# Patient Record
Sex: Male | Born: 1950 | Race: White | Hispanic: No | Marital: Married
Health system: Southern US, Community
[De-identification: ages and names within clinical notes are randomized; demographics above are authoritative.]

---

## 2012-08-18 ENCOUNTER — Emergency Department: Payer: Self-pay | Admitting: Unknown Physician Specialty

## 2012-08-18 LAB — COMPREHENSIVE METABOLIC PANEL
Albumin: 4.2 g/dL (ref 3.4–5.0)
Alkaline Phosphatase: 84 U/L (ref 50–136)
Bilirubin,Total: 0.6 mg/dL (ref 0.2–1.0)
Calcium, Total: 8.8 mg/dL (ref 8.5–10.1)
Chloride: 107 mmol/L (ref 98–107)
Co2: 24 mmol/L (ref 21–32)
Creatinine: 0.9 mg/dL (ref 0.60–1.30)
EGFR (African American): >60
Glucose: 108 mg/dL — ABNORMAL HIGH (ref 65–99)
Osmolality: 279 (ref 275–301)
SGOT(AST): 19 U/L (ref 15–37)
Sodium: 139 mmol/L (ref 136–145)
Total Protein: 7.1 g/dL (ref 6.4–8.2)

## 2012-08-18 LAB — TROPONIN I: Troponin-I: <0.02 ng/mL

## 2012-08-18 LAB — CBC
HCT: 40.9 % (ref 40.0–52.0)
HGB: 14.4 g/dL (ref 13.0–18.0)
MCH: 34.7 pg — ABNORMAL HIGH (ref 26.0–34.0)
MCV: 99 fL (ref 80–100)
Platelet: 243 10*3/uL (ref 150–440)

## 2013-06-25 ENCOUNTER — Ambulatory Visit: Payer: Self-pay | Admitting: Surgery

## 2013-06-26 LAB — PATHOLOGY REPORT

## 2014-10-10 NOTE — Op Note (Signed)
PATIENT NAME:  Jason SpatesULISS, Arnol M MR#:  [id number] DATE OF BIRTH:  1950-08-24  DATE OF PROCEDURE:  06/25/2013  PREOPERATIVE DIAGNOSIS: Screening colonoscopy.   POSTOPERATIVE DIAGNOSIS: Colonic polyps.   PROCEDURE: Colonoscopy, biopsy and cautery and snare polypectomy.   SURGEON: Renda RollsWilton Manie Bealer, M.D.   ANESTHESIA: Monitored anesthesia care provided by the anesthesia staff.   INDICATIONS: This 64 year old male has a family history of colonic polyps and was advised to have screening colonoscopy.   He was placed on the stretcher in the left lateral decubitus position, sedated and monitored by the anesthesia staff with intermittent blood pressure checks, continuous pulse oximetry and given oxygen via nasal cannula.   With the patient in the left lateral decubitus position, the Olympus videocolonoscope was inserted and manipulated around to the cecum, which was identified by the ileocecal valve and the appendiceal orifice and palpation in the right lower quadrant. Colon preparation was good. The scope was gradually pulled back examining the colonic mucosa. There were 2 small polyps encountered at the splenic flexure, one appeared to be some 5 mm in size and was sessile; the other was approximately 2 to 3 mm in size. The smaller one was removed with hot biopsy and the other was biopsied 2 times and then removed with snare and cautery, and all specimens placed in the same container as polyps were just about 2 inches apart. Bleeding was very scant. Hemostasis was subsequently intact. The scope was gradually further pulled back, seeing, no other polyps or tumors more distally in the colon or the rectum. The rectum was retroflexed and did see some minimal enlargement of internal hemorrhoids. The scope was again straightened, and carbon dioxide was aspirated, and the scope was removed.   Digital anorectal exam demonstrated no palpable mass.   The patient was then moved to the recovery room in satisfactory  condition.     ____________________________ Shela CommonsJ. Renda RollsWilton Natalie Leclaire, MD jws:dmm D: 06/25/2013 09:02:24 ET T: 06/25/2013 09:52:01 ET JOB#: [id number]  cc: Adella HareJ. Wilton Kalanie Fewell, MD, <Dictator> Adella HareWILTON J Alyssah Algeo MD ELECTRONICALLY SIGNED 06/26/2013 19:52

## 2019-08-18 DIAGNOSIS — E039 Hypothyroidism, unspecified: Secondary | ICD-10-CM | POA: Diagnosis not present

## 2019-08-18 DIAGNOSIS — F419 Anxiety disorder, unspecified: Secondary | ICD-10-CM | POA: Diagnosis not present

## 2019-08-18 DIAGNOSIS — Z6825 Body mass index (BMI) 25.0-25.9, adult: Secondary | ICD-10-CM | POA: Diagnosis not present

## 2019-08-18 DIAGNOSIS — R000 Tachycardia, unspecified: Secondary | ICD-10-CM | POA: Diagnosis not present

## 2019-08-18 DIAGNOSIS — G2581 Restless legs syndrome: Secondary | ICD-10-CM | POA: Diagnosis not present

## 2019-10-07 DIAGNOSIS — Z720 Tobacco use: Secondary | ICD-10-CM | POA: Diagnosis not present

## 2019-10-07 DIAGNOSIS — R05 Cough: Secondary | ICD-10-CM | POA: Diagnosis not present

## 2019-10-07 DIAGNOSIS — M5412 Radiculopathy, cervical region: Secondary | ICD-10-CM | POA: Diagnosis not present

## 2019-10-07 DIAGNOSIS — Z6826 Body mass index (BMI) 26.0-26.9, adult: Secondary | ICD-10-CM | POA: Diagnosis not present

## 2019-10-14 DIAGNOSIS — M50222 Other cervical disc displacement at C5-C6 level: Secondary | ICD-10-CM | POA: Diagnosis not present

## 2019-10-14 DIAGNOSIS — M5033 Other cervical disc degeneration, cervicothoracic region: Secondary | ICD-10-CM | POA: Diagnosis not present

## 2019-10-14 DIAGNOSIS — M4802 Spinal stenosis, cervical region: Secondary | ICD-10-CM | POA: Diagnosis not present

## 2019-10-14 DIAGNOSIS — R05 Cough: Secondary | ICD-10-CM | POA: Diagnosis not present

## 2019-10-14 DIAGNOSIS — J8410 Pulmonary fibrosis, unspecified: Secondary | ICD-10-CM | POA: Diagnosis not present

## 2019-11-18 DIAGNOSIS — Z1331 Encounter for screening for depression: Secondary | ICD-10-CM | POA: Diagnosis not present

## 2019-11-18 DIAGNOSIS — M5412 Radiculopathy, cervical region: Secondary | ICD-10-CM | POA: Diagnosis not present

## 2019-11-18 DIAGNOSIS — Z139 Encounter for screening, unspecified: Secondary | ICD-10-CM | POA: Diagnosis not present

## 2019-11-18 DIAGNOSIS — Z9181 History of falling: Secondary | ICD-10-CM | POA: Diagnosis not present

## 2019-11-18 DIAGNOSIS — Z79899 Other long term (current) drug therapy: Secondary | ICD-10-CM | POA: Diagnosis not present

## 2019-11-18 DIAGNOSIS — I10 Essential (primary) hypertension: Secondary | ICD-10-CM | POA: Diagnosis not present

## 2019-11-18 DIAGNOSIS — E7800 Pure hypercholesterolemia, unspecified: Secondary | ICD-10-CM | POA: Diagnosis not present

## 2019-11-18 DIAGNOSIS — M5030 Other cervical disc degeneration, unspecified cervical region: Secondary | ICD-10-CM | POA: Diagnosis not present

## 2019-11-18 DIAGNOSIS — Z1159 Encounter for screening for other viral diseases: Secondary | ICD-10-CM | POA: Diagnosis not present

## 2019-11-18 DIAGNOSIS — R7303 Prediabetes: Secondary | ICD-10-CM | POA: Diagnosis not present

## 2019-11-27 DIAGNOSIS — M5414 Radiculopathy, thoracic region: Secondary | ICD-10-CM | POA: Diagnosis not present

## 2019-11-27 DIAGNOSIS — M47814 Spondylosis without myelopathy or radiculopathy, thoracic region: Secondary | ICD-10-CM | POA: Diagnosis not present

## 2019-11-27 DIAGNOSIS — M546 Pain in thoracic spine: Secondary | ICD-10-CM | POA: Diagnosis not present

## 2019-11-27 DIAGNOSIS — M542 Cervicalgia: Secondary | ICD-10-CM | POA: Diagnosis not present

## 2019-11-28 ENCOUNTER — Other Ambulatory Visit: Payer: Self-pay | Admitting: Nurse Practitioner

## 2019-11-28 DIAGNOSIS — M5414 Radiculopathy, thoracic region: Secondary | ICD-10-CM

## 2019-11-28 DIAGNOSIS — M542 Cervicalgia: Secondary | ICD-10-CM

## 2019-12-11 ENCOUNTER — Other Ambulatory Visit: Payer: Self-pay

## 2019-12-11 ENCOUNTER — Ambulatory Visit
Admission: RE | Admit: 2019-12-11 | Discharge: 2019-12-11 | Disposition: A | Discharge status: Home / Self Care | Payer: Medicare PPO | Source: Ambulatory Visit | Attending: Nurse Practitioner | Admitting: Nurse Practitioner

## 2019-12-11 DIAGNOSIS — M542 Cervicalgia: Secondary | ICD-10-CM

## 2019-12-11 DIAGNOSIS — M5414 Radiculopathy, thoracic region: Secondary | ICD-10-CM | POA: Diagnosis not present

## 2019-12-11 DIAGNOSIS — M5124 Other intervertebral disc displacement, thoracic region: Secondary | ICD-10-CM | POA: Diagnosis not present

## 2019-12-11 DIAGNOSIS — M4804 Spinal stenosis, thoracic region: Secondary | ICD-10-CM | POA: Diagnosis not present

## 2019-12-11 IMAGING — MR MR THORACIC SPINE W/O CM
6 of 7 series · 27 of 48 positions shown · non-contrast
Comparison: Chest CT [DATE]

CLINICAL DATA: Thoracic radiculopathy. Chronic neck, back, and arm
pain including around the left scapula and anteriorly through the
left chest.

EXAM:
MRI THORACIC SPINE WITHOUT CONTRAST
TECHNIQUE: Multiplanar, multisequence MR imaging of the thoracic spine was
performed. No intravenous contrast was administered.

[Series 17: T2 · sagittal · 3.0mm · 1.06mm/px · 3 of 17 slices shown (1 of 2)]
[im 1/17]
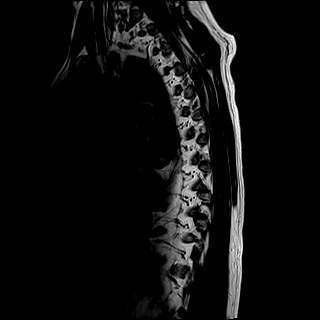
[im 9/17]
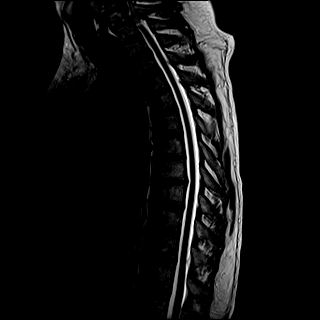
[im 17/17]
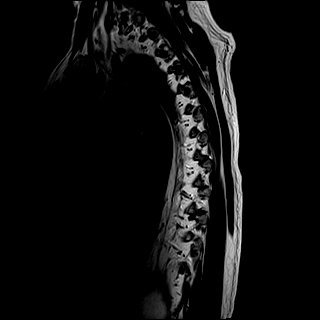

[Series 18: T1 · sagittal · 3.0mm · 1.06mm/px · 3 of 17 slices shown (1 of 2)]
[im 1/17]
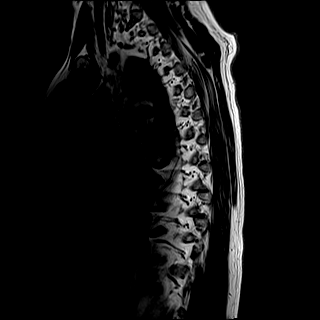
[im 9/17]
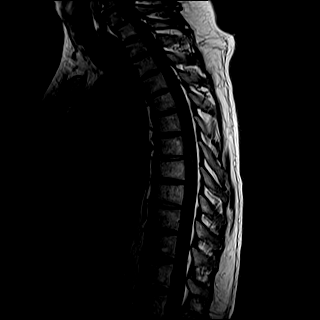
[im 17/17]
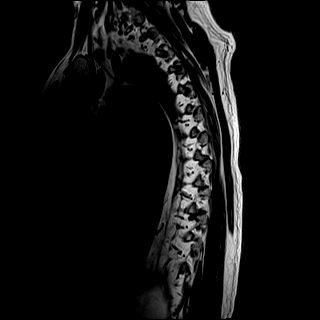

[Series 19: STIR · sagittal · 3.0mm · 0.53mm/px · 3 of 17 slices shown]
[im 1/17]
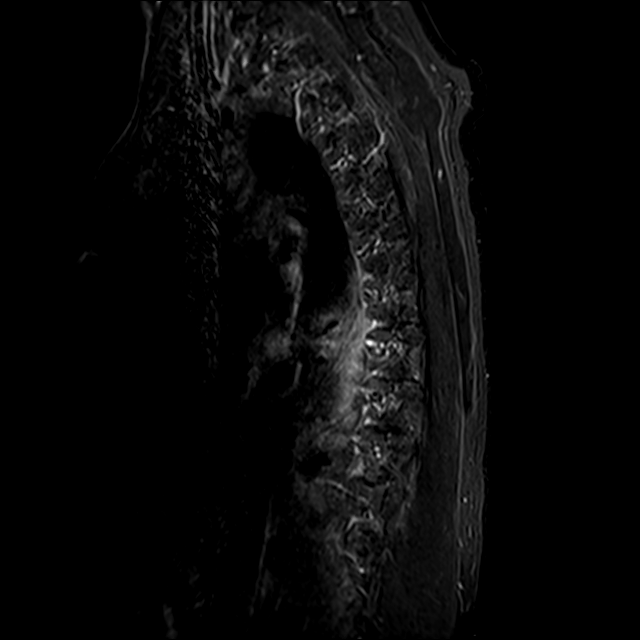
[im 9/17]
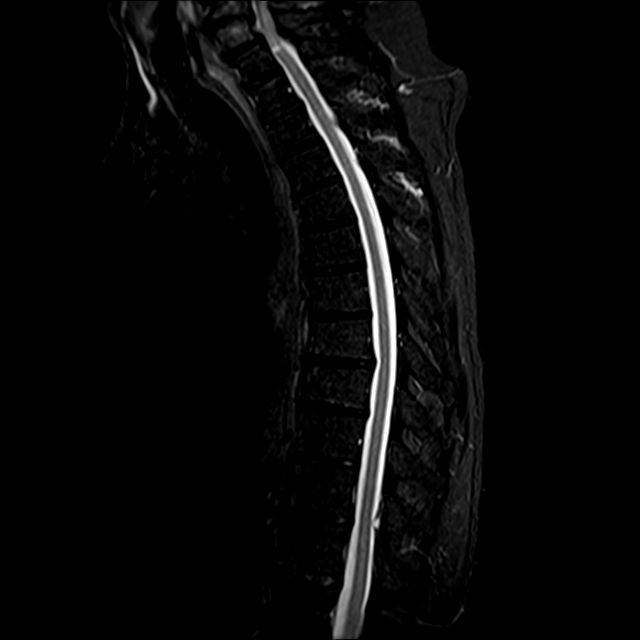
[im 17/17]
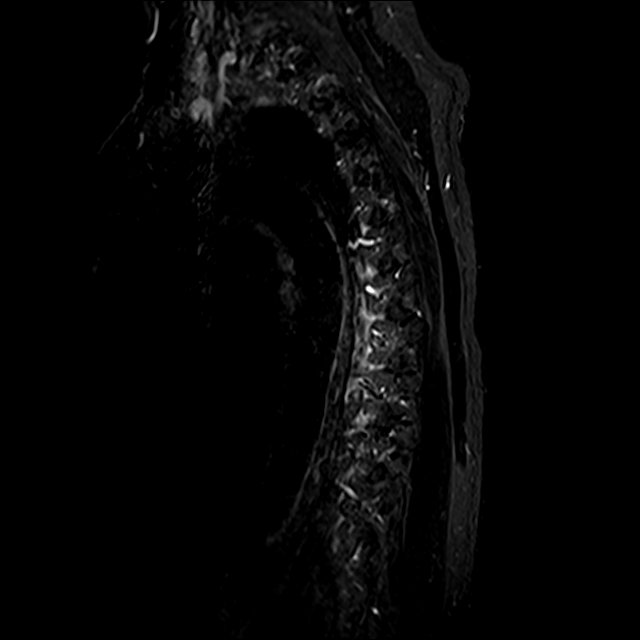

[Series 21: T2 · axial · 4.0mm · 0.59mm/px · z∈[-395,-154]mm · 8 of 39 slices shown (2 of 2)]
[im 1/39]
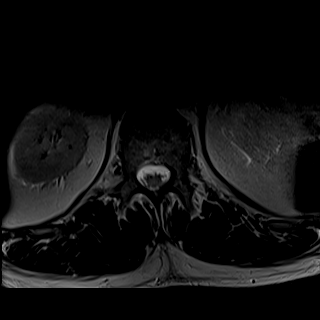
[im 6/39]
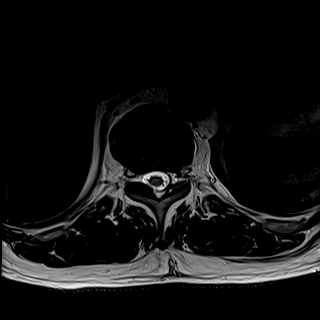
[im 11/39]
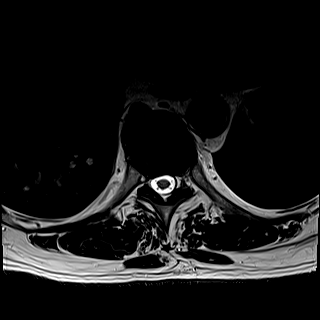
[im 17/39]
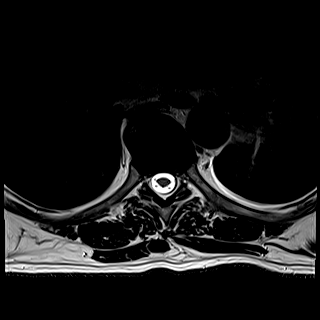
[im 22/39]
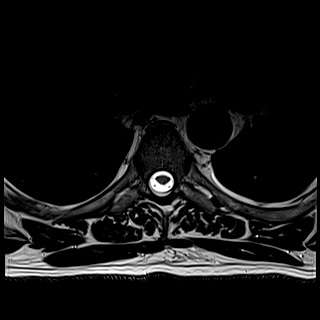
[im 28/39]
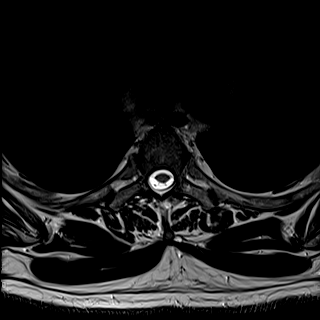
[im 33/39]
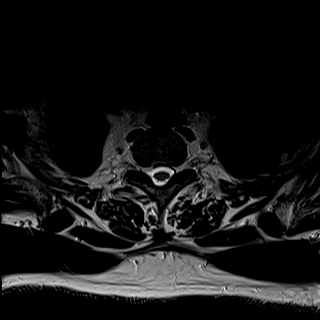
[im 39/39]
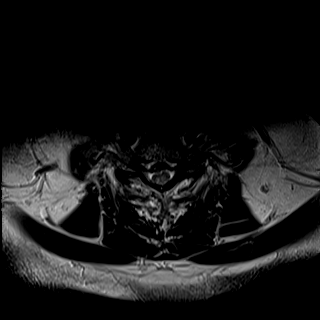

[Series 22: T1 · sagittal · 5.0mm · 1.88mm/px · 2 of 9 slices shown (2 of 2)]
[im 1/9]
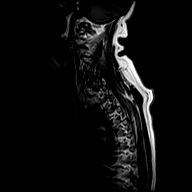
[im 9/9]
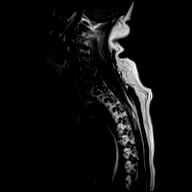

[Series 23: GRE · axial · 4.0mm · 0.37mm/px · z∈[-395,-154]mm · 8 of 39 slices shown]
[im 1/39]
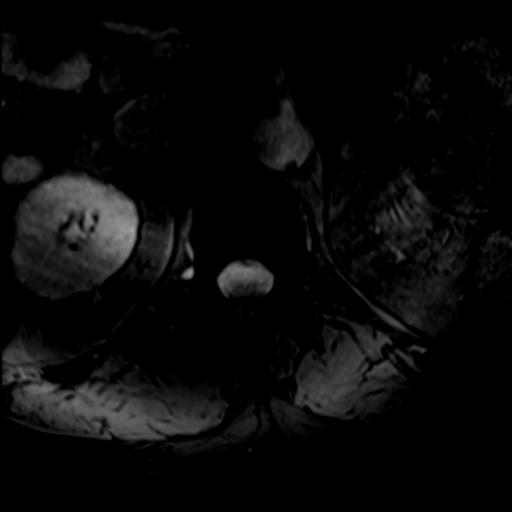
[im 6/39]
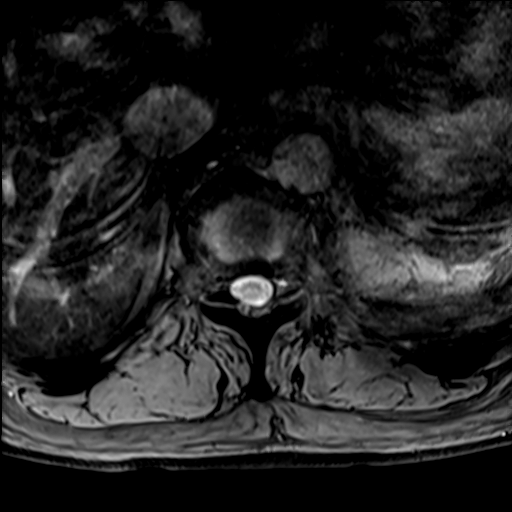
[im 11/39]
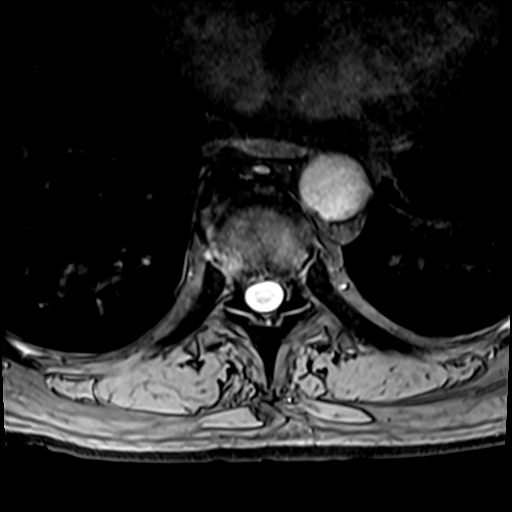
[im 17/39]
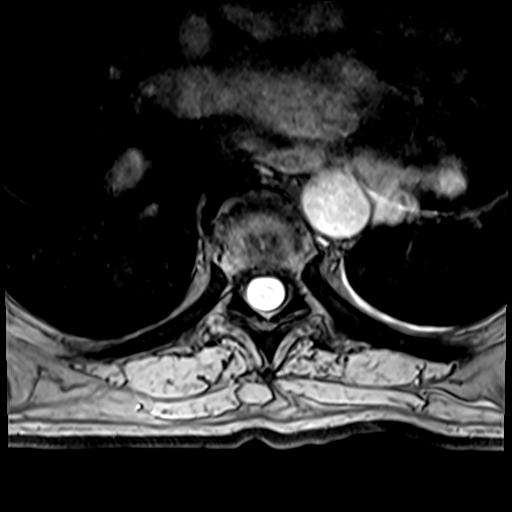
[im 22/39]
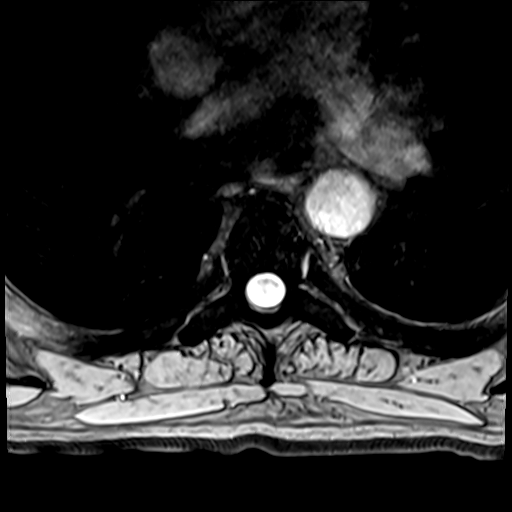
[im 28/39]
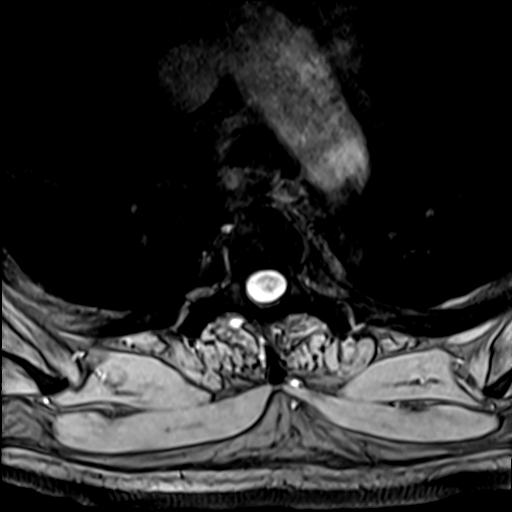
[im 33/39]
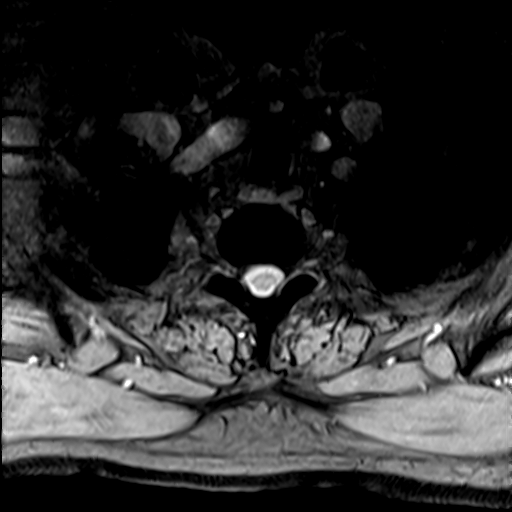
[im 39/39]
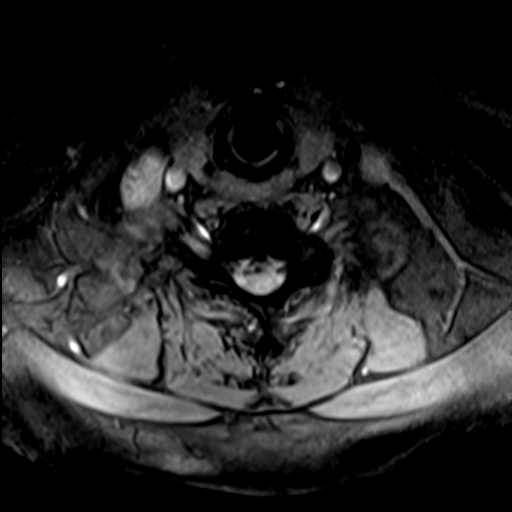

[27 of 48 positions shown; findings below may reference images not displayed]

FINDINGS: Alignment:  Grade 1 anterolisthesis of C7 on T1.

Vertebrae: No fracture or destructive osseous lesion. Edema along
the right aspect of the T12 inferior endplate, favored to be
degenerative. Edema in the C6 and C7 vertebral bodies, more fully
evaluated on separate cervical spine MRI.

Cord: Abnormal focal T2 hyperintensity in the spinal cord at the
upper T1 level. Normal thoracic cord signal elsewhere.

Paraspinal and other soft tissues: Unremarkable.

Disc levels:

Disc and severe facet degeneration at C7-T1 with grade 1
anterolisthesis, spinal stenosis, and advanced bilateral neural
foraminal stenosis, more fully evaluated on separate cervical spine
MRI.

Small right central disc protrusion at T8-9 and very shallow
central/right central disc protrusion at T9-10 without associated
stenosis or significant spinal cord mass effect. Moderate to severe
multilevel upper thoracic facet arthrosis with mild disc bulging and
endplate spurring at T1-2 contributing to mild-to-moderate right
neural foraminal stenosis.
IMPRESSION: 1. Severe facet arthrosis at C7-T1 with grade 1 anterolisthesis,
spinal stenosis, and advanced bilateral neural foraminal stenosis as
seen on the separate cervical spine MRI. Focal spinal cord signal
abnormality at this level compatible with myelomalacia.
2. Advanced upper thoracic facet arthrosis with mild-to-moderate
right neural foraminal stenosis at T1-2.
3. Small disc protrusions at T8-9 and T9-10 without stenosis.

## 2019-12-11 IMAGING — MR MR CERVICAL SPINE W/O CM
5 series · 37 of 48 positions shown · non-contrast
Comparison: None.

CLINICAL DATA: Neck pain. Left scapula and chest pain. Left upper
extremity pain to the wrist. If

EXAM:
MRI CERVICAL SPINE WITHOUT CONTRAST
TECHNIQUE: Multiplanar, multisequence MR imaging of the cervical spine was
performed. No intravenous contrast was administered.

[Series 5: T2 · sagittal · 3.0mm · 0.62mm/px · 6 of 15 slices shown (1 of 2)]
[im 1/15]
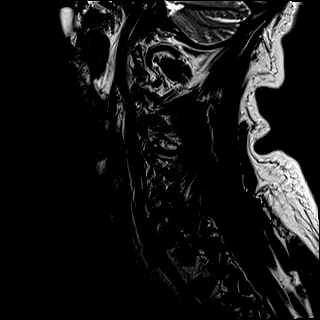
[im 3/15]
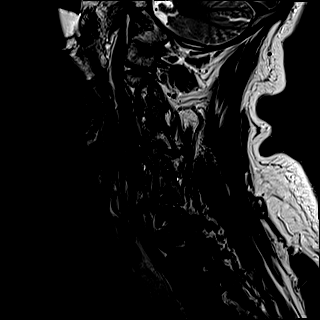
[im 6/15]
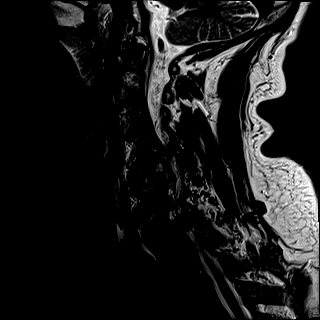
[im 9/15]
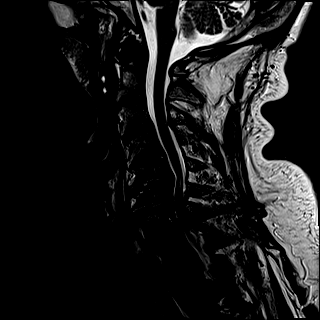
[im 12/15]
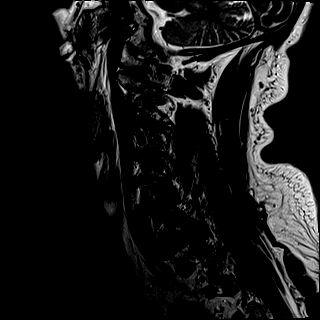
[im 15/15]
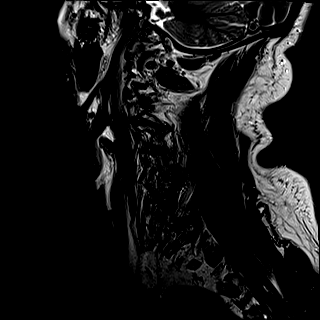

[Series 6: FLAIR · sagittal · 3.0mm · 0.78mm/px · 7 of 15 slices shown]
[im 1/15]
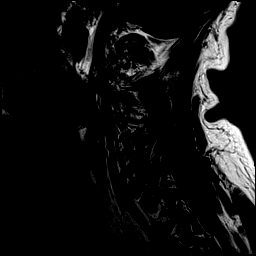
[im 3/15]
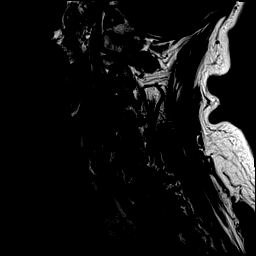
[im 5/15]
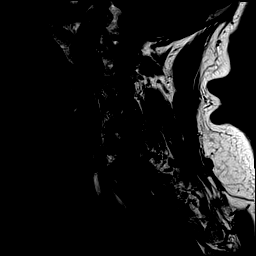
[im 8/15]
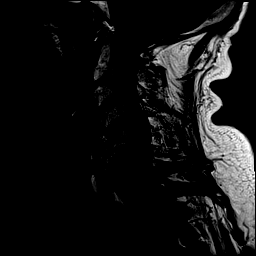
[im 10/15]
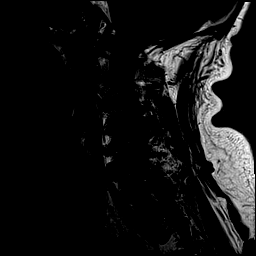
[im 12/15]
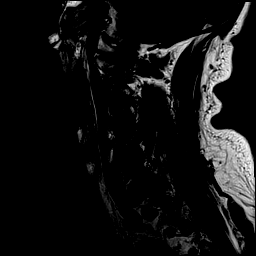
[im 15/15]
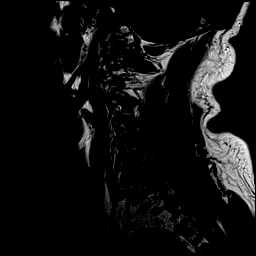

[Series 7: STIR · sagittal · 3.0mm · 0.62mm/px · 7 of 15 slices shown]
[im 1/15]
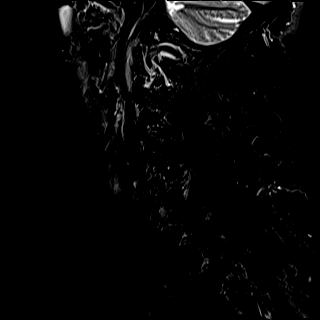
[im 3/15]
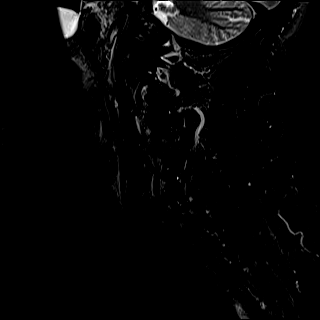
[im 5/15]
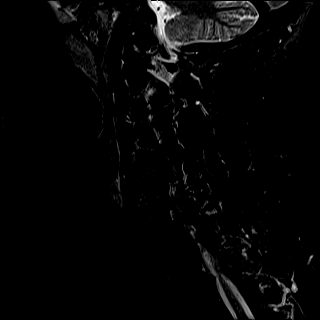
[im 8/15]
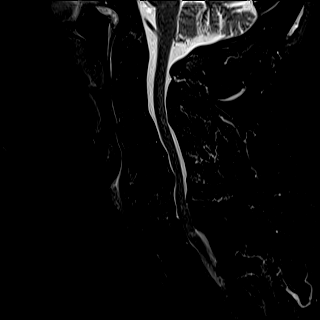
[im 10/15]
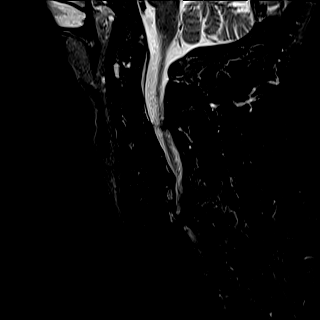
[im 12/15]
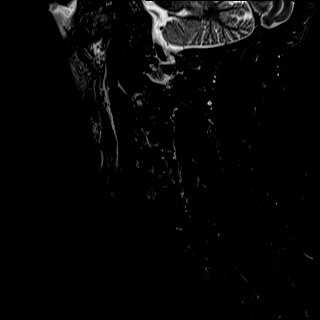
[im 15/15]
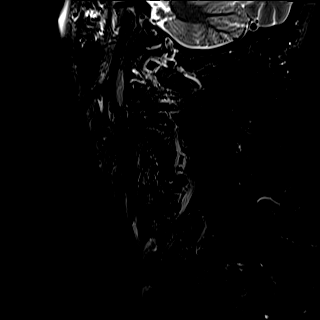

[Series 8: T2 · axial · 3.0mm · 0.70mm/px · z∈[-142,-46]mm · 9 of 29 slices shown (2 of 2)]
[im 1/29]
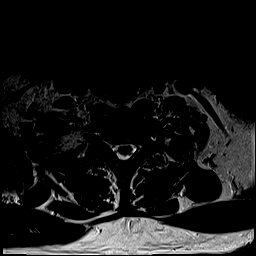
[im 3/29]
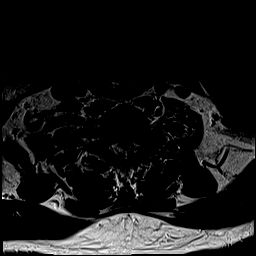
[im 5/29]
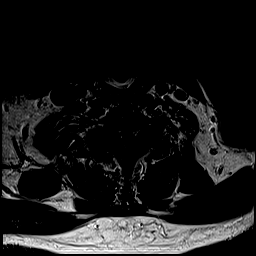
[im 9/29]
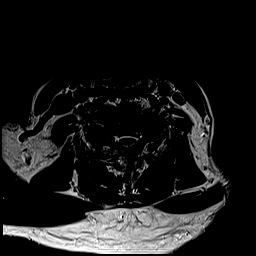
[im 13/29]
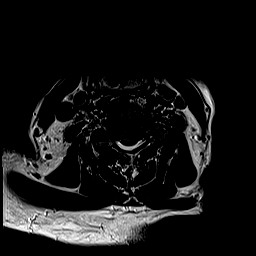
[im 16/29]
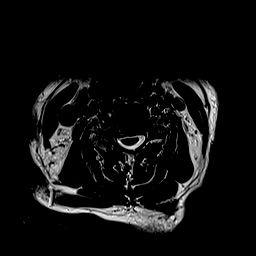
[im 20/29]
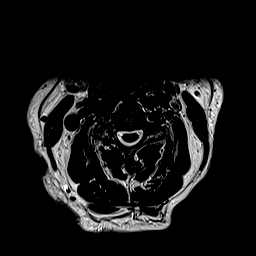
[im 24/29]
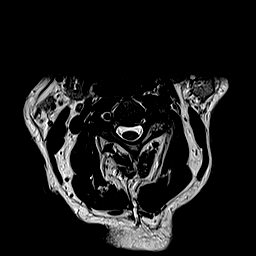
[im 29/29]
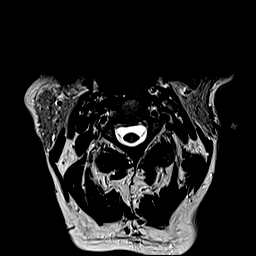

[Series 9: ax mpgr · axial · 3.0mm · 0.35mm/px · z∈[-142,-46]mm · 8 of 29 slices shown]
[im 1/29]
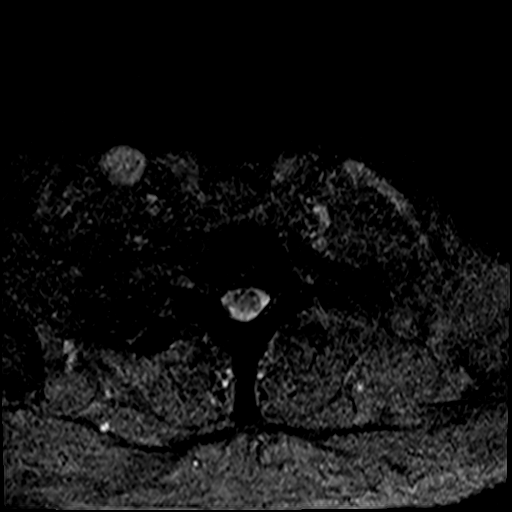
[im 5/29]
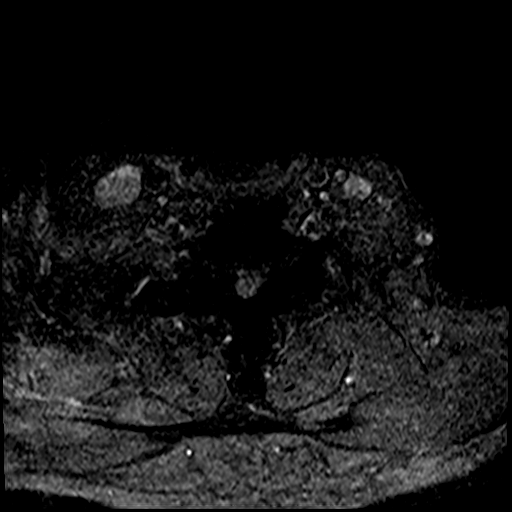
[im 9/29]
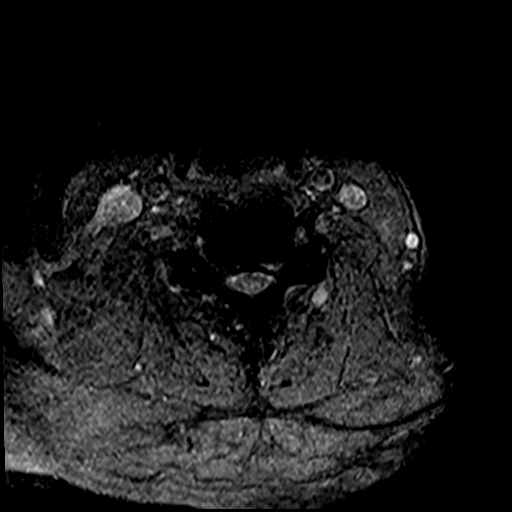
[im 13/29]
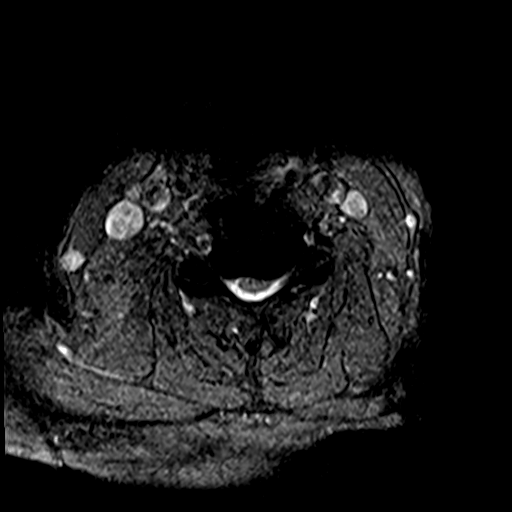
[im 16/29]
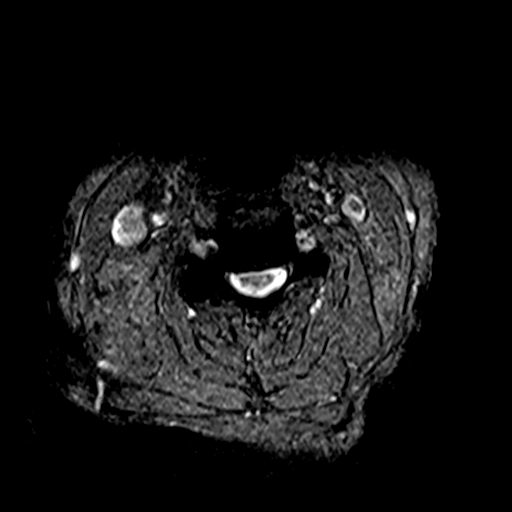
[im 20/29]
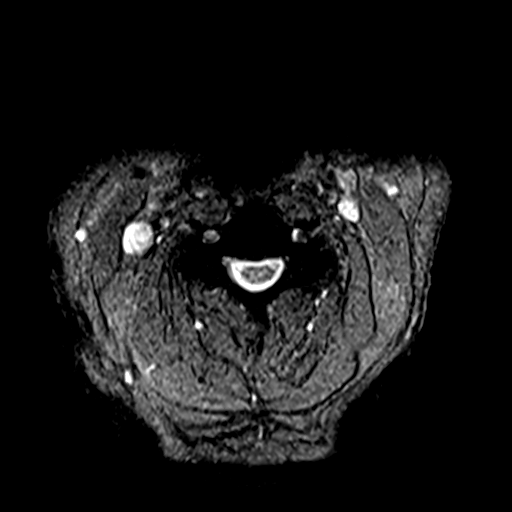
[im 24/29]
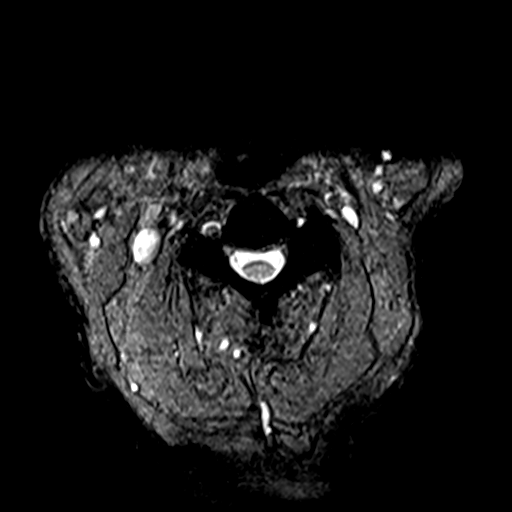
[im 29/29]
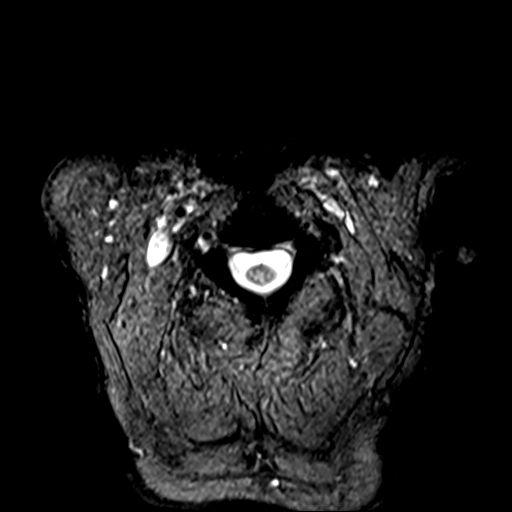

[37 of 48 positions shown; findings below may reference images not displayed]

FINDINGS: Alignment: Slight retrolisthesis is present at C5-6 and C6-7. Grade
1 anterolisthesis at C7-T1 measures 4 mm. Slight anterolisthesis is
present at C3-4.

Vertebrae: Chronic fatty and edematous endplate marrow changes are
present at C5-6 and C6-7. Vertebral body heights are maintained.
There is some edema within the C6 and C7 vertebral bodies.

Cord: Focal T2 hyperintensity in the left lateral aspect of the cord
at C5 likely represents chronic myelomalacia. More mild T2
hyperintensity of the cord is present at C6-7 and C7-T1. No other
cord signal abnormalities are present. Significant narrowing of the
spinal cord is present at C5-6.

Posterior Fossa, vertebral arteries, paraspinal tissues:
Craniocervical junction is normal. Flow is present in the vertebral
arteries bilaterally. Visualized intracranial contents are normal.

Disc levels:

C1-2: Negative.

C2-3: Negative.

C3-4: A shallow disc bulge is present. Asymmetric left-sided facet
hypertrophy results in severe left and moderate right foraminal
stenosis.

C4-5: A rightward disc osteophyte complex is present. Facet
hypertrophy is worse on the right. Moderate foraminal scratched at
moderate to severe foraminal narrowing is present bilaterally, right
greater than left. There is effacement ventral CSF and some
narrowing of the cord.

C5-6: A broad-based disc osteophyte complex effaces the ventral CSF
and narrows the canal to 7 mm. Severe foraminal stenosis is present
bilaterally.

C6-7: A broad-based disc osteophyte complex is present. Effacement
of the ventral CSF is noted. Uncovertebral spurring contributes to
severe foraminal narrowing, left greater than right.

C7-T1: Advanced facet hypertrophy is present. Broad-based disc
protrusion extends into the foramina bilaterally. Severe right and
moderate left foraminal narrowing is present.
IMPRESSION: 1. Severe left and moderate right foraminal stenosis at C3-4.
2. Moderate foraminal narrowing bilaterally at C4-5 is worse on the
right.
3. Severe foraminal narrowing bilaterally at C5-6 and C6-7 is worse
on the left.
4. Severe right and moderate left foraminal stenosis at C7-T1.
5. Moderate central canal stenosis at C5-6 and C6-7.
6. Abnormal cord signal at C5-6 and C6-7 is likely due to chronic
myelomalacia.

## 2019-12-19 DIAGNOSIS — M542 Cervicalgia: Secondary | ICD-10-CM | POA: Diagnosis not present

## 2019-12-19 DIAGNOSIS — M5412 Radiculopathy, cervical region: Secondary | ICD-10-CM | POA: Diagnosis not present

## 2019-12-19 DIAGNOSIS — M4802 Spinal stenosis, cervical region: Secondary | ICD-10-CM | POA: Diagnosis not present

## 2020-01-07 DIAGNOSIS — M5412 Radiculopathy, cervical region: Secondary | ICD-10-CM | POA: Diagnosis not present

## 2020-01-21 DIAGNOSIS — M5414 Radiculopathy, thoracic region: Secondary | ICD-10-CM | POA: Diagnosis not present

## 2020-01-21 DIAGNOSIS — G8929 Other chronic pain: Secondary | ICD-10-CM | POA: Diagnosis not present

## 2020-01-21 DIAGNOSIS — M546 Pain in thoracic spine: Secondary | ICD-10-CM | POA: Diagnosis not present

## 2020-01-21 DIAGNOSIS — M542 Cervicalgia: Secondary | ICD-10-CM | POA: Diagnosis not present

## 2020-01-21 DIAGNOSIS — M5412 Radiculopathy, cervical region: Secondary | ICD-10-CM | POA: Diagnosis not present

## 2020-01-21 DIAGNOSIS — M4802 Spinal stenosis, cervical region: Secondary | ICD-10-CM | POA: Diagnosis not present

## 2020-02-05 DIAGNOSIS — Z79899 Other long term (current) drug therapy: Secondary | ICD-10-CM | POA: Diagnosis not present

## 2020-02-05 DIAGNOSIS — R590 Localized enlarged lymph nodes: Secondary | ICD-10-CM | POA: Diagnosis not present

## 2020-02-05 DIAGNOSIS — I700 Atherosclerosis of aorta: Secondary | ICD-10-CM | POA: Diagnosis not present

## 2020-02-05 DIAGNOSIS — R918 Other nonspecific abnormal finding of lung field: Secondary | ICD-10-CM | POA: Diagnosis not present

## 2020-02-05 DIAGNOSIS — R042 Hemoptysis: Secondary | ICD-10-CM | POA: Diagnosis not present

## 2020-02-05 DIAGNOSIS — J439 Emphysema, unspecified: Secondary | ICD-10-CM | POA: Diagnosis not present

## 2020-02-05 DIAGNOSIS — Z6826 Body mass index (BMI) 26.0-26.9, adult: Secondary | ICD-10-CM | POA: Diagnosis not present

## 2020-02-12 DIAGNOSIS — I2510 Atherosclerotic heart disease of native coronary artery without angina pectoris: Secondary | ICD-10-CM | POA: Diagnosis not present

## 2020-02-12 DIAGNOSIS — R918 Other nonspecific abnormal finding of lung field: Secondary | ICD-10-CM | POA: Diagnosis not present

## 2020-02-12 DIAGNOSIS — J439 Emphysema, unspecified: Secondary | ICD-10-CM | POA: Diagnosis not present

## 2020-02-12 DIAGNOSIS — R042 Hemoptysis: Secondary | ICD-10-CM | POA: Diagnosis not present

## 2020-02-12 DIAGNOSIS — I700 Atherosclerosis of aorta: Secondary | ICD-10-CM | POA: Diagnosis not present

## 2020-02-18 DIAGNOSIS — R05 Cough: Secondary | ICD-10-CM | POA: Diagnosis not present

## 2020-02-18 DIAGNOSIS — R042 Hemoptysis: Secondary | ICD-10-CM | POA: Diagnosis not present

## 2020-02-18 DIAGNOSIS — R918 Other nonspecific abnormal finding of lung field: Secondary | ICD-10-CM | POA: Diagnosis not present

## 2020-02-20 ENCOUNTER — Other Ambulatory Visit: Payer: Self-pay | Admitting: Specialist

## 2020-02-20 DIAGNOSIS — R918 Other nonspecific abnormal finding of lung field: Secondary | ICD-10-CM

## 2020-02-24 DIAGNOSIS — R918 Other nonspecific abnormal finding of lung field: Secondary | ICD-10-CM | POA: Diagnosis not present

## 2020-02-26 ENCOUNTER — Institutional Professional Consult (permissible substitution): Payer: Medicare PPO | Admitting: Pulmonary Disease

## 2020-02-26 ENCOUNTER — Other Ambulatory Visit: Payer: Self-pay | Admitting: Pulmonary Disease

## 2020-02-29 DIAGNOSIS — Z20822 Contact with and (suspected) exposure to covid-19: Secondary | ICD-10-CM | POA: Diagnosis not present

## 2020-03-03 DIAGNOSIS — Z7951 Long term (current) use of inhaled steroids: Secondary | ICD-10-CM | POA: Diagnosis not present

## 2020-03-03 DIAGNOSIS — D491 Neoplasm of unspecified behavior of respiratory system: Secondary | ICD-10-CM | POA: Diagnosis not present

## 2020-03-03 DIAGNOSIS — R591 Generalized enlarged lymph nodes: Secondary | ICD-10-CM | POA: Diagnosis not present

## 2020-03-03 DIAGNOSIS — J449 Chronic obstructive pulmonary disease, unspecified: Secondary | ICD-10-CM | POA: Diagnosis not present

## 2020-03-03 DIAGNOSIS — E785 Hyperlipidemia, unspecified: Secondary | ICD-10-CM | POA: Diagnosis not present

## 2020-03-03 DIAGNOSIS — C771 Secondary and unspecified malignant neoplasm of intrathoracic lymph nodes: Secondary | ICD-10-CM | POA: Diagnosis not present

## 2020-03-03 DIAGNOSIS — C3490 Malignant neoplasm of unspecified part of unspecified bronchus or lung: Secondary | ICD-10-CM | POA: Diagnosis not present

## 2020-03-03 DIAGNOSIS — R918 Other nonspecific abnormal finding of lung field: Secondary | ICD-10-CM | POA: Diagnosis not present

## 2020-03-03 DIAGNOSIS — Z79899 Other long term (current) drug therapy: Secondary | ICD-10-CM | POA: Diagnosis not present

## 2020-03-03 DIAGNOSIS — C7989 Secondary malignant neoplasm of other specified sites: Secondary | ICD-10-CM | POA: Diagnosis not present

## 2020-03-03 DIAGNOSIS — Z87891 Personal history of nicotine dependence: Secondary | ICD-10-CM | POA: Diagnosis not present

## 2020-03-03 DIAGNOSIS — C801 Malignant (primary) neoplasm, unspecified: Secondary | ICD-10-CM | POA: Diagnosis not present

## 2020-03-05 DIAGNOSIS — R918 Other nonspecific abnormal finding of lung field: Secondary | ICD-10-CM | POA: Diagnosis not present

## 2020-03-05 DIAGNOSIS — R930 Abnormal findings on diagnostic imaging of skull and head, not elsewhere classified: Secondary | ICD-10-CM | POA: Diagnosis not present

## 2020-03-12 ENCOUNTER — Other Ambulatory Visit: Payer: Medicare PPO

## 2020-03-16 DIAGNOSIS — E785 Hyperlipidemia, unspecified: Secondary | ICD-10-CM | POA: Diagnosis not present

## 2020-03-16 DIAGNOSIS — C3490 Malignant neoplasm of unspecified part of unspecified bronchus or lung: Secondary | ICD-10-CM | POA: Diagnosis not present

## 2020-03-16 DIAGNOSIS — J449 Chronic obstructive pulmonary disease, unspecified: Secondary | ICD-10-CM | POA: Diagnosis not present

## 2020-03-16 DIAGNOSIS — Z87891 Personal history of nicotine dependence: Secondary | ICD-10-CM | POA: Diagnosis not present

## 2020-03-16 DIAGNOSIS — Z79899 Other long term (current) drug therapy: Secondary | ICD-10-CM | POA: Diagnosis not present

## 2020-03-17 ENCOUNTER — Other Ambulatory Visit: Admission: RE | Admit: 2020-03-17 | Payer: Medicare PPO | Source: Ambulatory Visit

## 2020-03-19 ENCOUNTER — Encounter: Admission: RE | Payer: Self-pay | Source: Home / Self Care

## 2020-03-19 ENCOUNTER — Ambulatory Visit: Admission: RE | Admit: 2020-03-19 | Payer: Medicare PPO | Source: Home / Self Care

## 2020-03-19 DIAGNOSIS — C3490 Malignant neoplasm of unspecified part of unspecified bronchus or lung: Secondary | ICD-10-CM | POA: Diagnosis not present

## 2020-03-19 DIAGNOSIS — J449 Chronic obstructive pulmonary disease, unspecified: Secondary | ICD-10-CM | POA: Diagnosis not present

## 2020-03-19 DIAGNOSIS — C3482 Malignant neoplasm of overlapping sites of left bronchus and lung: Secondary | ICD-10-CM | POA: Diagnosis not present

## 2020-03-19 DIAGNOSIS — Z87891 Personal history of nicotine dependence: Secondary | ICD-10-CM | POA: Diagnosis not present

## 2020-03-19 DIAGNOSIS — E785 Hyperlipidemia, unspecified: Secondary | ICD-10-CM | POA: Diagnosis not present

## 2020-03-19 DIAGNOSIS — Z79899 Other long term (current) drug therapy: Secondary | ICD-10-CM | POA: Diagnosis not present

## 2020-03-19 SURGERY — BRONCHOSCOPY, WITH EBUS
Anesthesia: General

## 2020-03-22 DIAGNOSIS — R7303 Prediabetes: Secondary | ICD-10-CM | POA: Diagnosis not present

## 2020-03-22 DIAGNOSIS — F419 Anxiety disorder, unspecified: Secondary | ICD-10-CM | POA: Diagnosis not present

## 2020-03-22 DIAGNOSIS — I700 Atherosclerosis of aorta: Secondary | ICD-10-CM | POA: Diagnosis not present

## 2020-03-22 DIAGNOSIS — I10 Essential (primary) hypertension: Secondary | ICD-10-CM | POA: Diagnosis not present

## 2020-03-22 DIAGNOSIS — M5412 Radiculopathy, cervical region: Secondary | ICD-10-CM | POA: Diagnosis not present

## 2020-03-22 DIAGNOSIS — G2581 Restless legs syndrome: Secondary | ICD-10-CM | POA: Diagnosis not present

## 2020-03-22 DIAGNOSIS — C3490 Malignant neoplasm of unspecified part of unspecified bronchus or lung: Secondary | ICD-10-CM | POA: Diagnosis not present

## 2020-03-22 DIAGNOSIS — E7800 Pure hypercholesterolemia, unspecified: Secondary | ICD-10-CM | POA: Diagnosis not present

## 2020-03-22 DIAGNOSIS — M5030 Other cervical disc degeneration, unspecified cervical region: Secondary | ICD-10-CM | POA: Diagnosis not present

## 2020-03-29 DIAGNOSIS — J449 Chronic obstructive pulmonary disease, unspecified: Secondary | ICD-10-CM | POA: Diagnosis not present

## 2020-03-29 DIAGNOSIS — Z87891 Personal history of nicotine dependence: Secondary | ICD-10-CM | POA: Diagnosis not present

## 2020-03-29 DIAGNOSIS — E785 Hyperlipidemia, unspecified: Secondary | ICD-10-CM | POA: Diagnosis not present

## 2020-03-29 DIAGNOSIS — Z79899 Other long term (current) drug therapy: Secondary | ICD-10-CM | POA: Diagnosis not present

## 2020-03-29 DIAGNOSIS — C3490 Malignant neoplasm of unspecified part of unspecified bronchus or lung: Secondary | ICD-10-CM | POA: Diagnosis not present

## 2020-03-29 DIAGNOSIS — C3482 Malignant neoplasm of overlapping sites of left bronchus and lung: Secondary | ICD-10-CM | POA: Diagnosis not present

## 2020-03-30 DIAGNOSIS — Z79899 Other long term (current) drug therapy: Secondary | ICD-10-CM | POA: Diagnosis not present

## 2020-03-30 DIAGNOSIS — E785 Hyperlipidemia, unspecified: Secondary | ICD-10-CM | POA: Diagnosis not present

## 2020-03-30 DIAGNOSIS — C3492 Malignant neoplasm of unspecified part of left bronchus or lung: Secondary | ICD-10-CM | POA: Diagnosis not present

## 2020-03-30 DIAGNOSIS — J449 Chronic obstructive pulmonary disease, unspecified: Secondary | ICD-10-CM | POA: Diagnosis not present

## 2020-03-30 DIAGNOSIS — C3490 Malignant neoplasm of unspecified part of unspecified bronchus or lung: Secondary | ICD-10-CM | POA: Diagnosis not present

## 2020-03-30 DIAGNOSIS — Z5111 Encounter for antineoplastic chemotherapy: Secondary | ICD-10-CM | POA: Diagnosis not present

## 2020-03-30 DIAGNOSIS — C3482 Malignant neoplasm of overlapping sites of left bronchus and lung: Secondary | ICD-10-CM | POA: Diagnosis not present

## 2020-03-30 DIAGNOSIS — Z87891 Personal history of nicotine dependence: Secondary | ICD-10-CM | POA: Diagnosis not present

## 2020-03-31 DIAGNOSIS — Z87891 Personal history of nicotine dependence: Secondary | ICD-10-CM | POA: Diagnosis not present

## 2020-03-31 DIAGNOSIS — C3482 Malignant neoplasm of overlapping sites of left bronchus and lung: Secondary | ICD-10-CM | POA: Diagnosis not present

## 2020-03-31 DIAGNOSIS — C3490 Malignant neoplasm of unspecified part of unspecified bronchus or lung: Secondary | ICD-10-CM | POA: Diagnosis not present

## 2020-03-31 DIAGNOSIS — Z79899 Other long term (current) drug therapy: Secondary | ICD-10-CM | POA: Diagnosis not present

## 2020-03-31 DIAGNOSIS — E785 Hyperlipidemia, unspecified: Secondary | ICD-10-CM | POA: Diagnosis not present

## 2020-03-31 DIAGNOSIS — J449 Chronic obstructive pulmonary disease, unspecified: Secondary | ICD-10-CM | POA: Diagnosis not present

## 2020-04-01 DIAGNOSIS — C3490 Malignant neoplasm of unspecified part of unspecified bronchus or lung: Secondary | ICD-10-CM | POA: Diagnosis not present

## 2020-04-01 DIAGNOSIS — Z79899 Other long term (current) drug therapy: Secondary | ICD-10-CM | POA: Diagnosis not present

## 2020-04-01 DIAGNOSIS — J449 Chronic obstructive pulmonary disease, unspecified: Secondary | ICD-10-CM | POA: Diagnosis not present

## 2020-04-01 DIAGNOSIS — E785 Hyperlipidemia, unspecified: Secondary | ICD-10-CM | POA: Diagnosis not present

## 2020-04-01 DIAGNOSIS — Z87891 Personal history of nicotine dependence: Secondary | ICD-10-CM | POA: Diagnosis not present

## 2020-04-01 DIAGNOSIS — C3482 Malignant neoplasm of overlapping sites of left bronchus and lung: Secondary | ICD-10-CM | POA: Diagnosis not present

## 2020-04-02 DIAGNOSIS — C3490 Malignant neoplasm of unspecified part of unspecified bronchus or lung: Secondary | ICD-10-CM | POA: Diagnosis not present

## 2020-04-02 DIAGNOSIS — J449 Chronic obstructive pulmonary disease, unspecified: Secondary | ICD-10-CM | POA: Diagnosis not present

## 2020-04-02 DIAGNOSIS — C3482 Malignant neoplasm of overlapping sites of left bronchus and lung: Secondary | ICD-10-CM | POA: Diagnosis not present

## 2020-04-02 DIAGNOSIS — Z87891 Personal history of nicotine dependence: Secondary | ICD-10-CM | POA: Diagnosis not present

## 2020-04-02 DIAGNOSIS — E785 Hyperlipidemia, unspecified: Secondary | ICD-10-CM | POA: Diagnosis not present

## 2020-04-02 DIAGNOSIS — Z79899 Other long term (current) drug therapy: Secondary | ICD-10-CM | POA: Diagnosis not present

## 2020-04-05 DIAGNOSIS — C3490 Malignant neoplasm of unspecified part of unspecified bronchus or lung: Secondary | ICD-10-CM | POA: Diagnosis not present

## 2020-04-05 DIAGNOSIS — E785 Hyperlipidemia, unspecified: Secondary | ICD-10-CM | POA: Diagnosis not present

## 2020-04-05 DIAGNOSIS — Z87891 Personal history of nicotine dependence: Secondary | ICD-10-CM | POA: Diagnosis not present

## 2020-04-05 DIAGNOSIS — C3482 Malignant neoplasm of overlapping sites of left bronchus and lung: Secondary | ICD-10-CM | POA: Diagnosis not present

## 2020-04-05 DIAGNOSIS — Z79899 Other long term (current) drug therapy: Secondary | ICD-10-CM | POA: Diagnosis not present

## 2020-04-05 DIAGNOSIS — J449 Chronic obstructive pulmonary disease, unspecified: Secondary | ICD-10-CM | POA: Diagnosis not present

## 2020-04-06 DIAGNOSIS — J449 Chronic obstructive pulmonary disease, unspecified: Secondary | ICD-10-CM | POA: Diagnosis not present

## 2020-04-06 DIAGNOSIS — C3490 Malignant neoplasm of unspecified part of unspecified bronchus or lung: Secondary | ICD-10-CM | POA: Diagnosis not present

## 2020-04-06 DIAGNOSIS — Z87891 Personal history of nicotine dependence: Secondary | ICD-10-CM | POA: Diagnosis not present

## 2020-04-06 DIAGNOSIS — E785 Hyperlipidemia, unspecified: Secondary | ICD-10-CM | POA: Diagnosis not present

## 2020-04-06 DIAGNOSIS — C3482 Malignant neoplasm of overlapping sites of left bronchus and lung: Secondary | ICD-10-CM | POA: Diagnosis not present

## 2020-04-06 DIAGNOSIS — Z79899 Other long term (current) drug therapy: Secondary | ICD-10-CM | POA: Diagnosis not present

## 2020-04-07 DIAGNOSIS — C3490 Malignant neoplasm of unspecified part of unspecified bronchus or lung: Secondary | ICD-10-CM | POA: Diagnosis not present

## 2020-04-07 DIAGNOSIS — C3482 Malignant neoplasm of overlapping sites of left bronchus and lung: Secondary | ICD-10-CM | POA: Diagnosis not present

## 2020-04-07 DIAGNOSIS — Z79899 Other long term (current) drug therapy: Secondary | ICD-10-CM | POA: Diagnosis not present

## 2020-04-07 DIAGNOSIS — Z87891 Personal history of nicotine dependence: Secondary | ICD-10-CM | POA: Diagnosis not present

## 2020-04-07 DIAGNOSIS — J449 Chronic obstructive pulmonary disease, unspecified: Secondary | ICD-10-CM | POA: Diagnosis not present

## 2020-04-07 DIAGNOSIS — E785 Hyperlipidemia, unspecified: Secondary | ICD-10-CM | POA: Diagnosis not present

## 2020-04-08 DIAGNOSIS — E785 Hyperlipidemia, unspecified: Secondary | ICD-10-CM | POA: Diagnosis not present

## 2020-04-08 DIAGNOSIS — C3490 Malignant neoplasm of unspecified part of unspecified bronchus or lung: Secondary | ICD-10-CM | POA: Diagnosis not present

## 2020-04-08 DIAGNOSIS — C3482 Malignant neoplasm of overlapping sites of left bronchus and lung: Secondary | ICD-10-CM | POA: Diagnosis not present

## 2020-04-08 DIAGNOSIS — Z79899 Other long term (current) drug therapy: Secondary | ICD-10-CM | POA: Diagnosis not present

## 2020-04-08 DIAGNOSIS — J449 Chronic obstructive pulmonary disease, unspecified: Secondary | ICD-10-CM | POA: Diagnosis not present

## 2020-04-08 DIAGNOSIS — Z87891 Personal history of nicotine dependence: Secondary | ICD-10-CM | POA: Diagnosis not present

## 2020-04-09 DIAGNOSIS — C3482 Malignant neoplasm of overlapping sites of left bronchus and lung: Secondary | ICD-10-CM | POA: Diagnosis not present

## 2020-04-09 DIAGNOSIS — Z87891 Personal history of nicotine dependence: Secondary | ICD-10-CM | POA: Diagnosis not present

## 2020-04-09 DIAGNOSIS — C3490 Malignant neoplasm of unspecified part of unspecified bronchus or lung: Secondary | ICD-10-CM | POA: Diagnosis not present

## 2020-04-09 DIAGNOSIS — E785 Hyperlipidemia, unspecified: Secondary | ICD-10-CM | POA: Diagnosis not present

## 2020-04-09 DIAGNOSIS — J449 Chronic obstructive pulmonary disease, unspecified: Secondary | ICD-10-CM | POA: Diagnosis not present

## 2020-04-09 DIAGNOSIS — Z79899 Other long term (current) drug therapy: Secondary | ICD-10-CM | POA: Diagnosis not present

## 2020-04-12 DIAGNOSIS — C3490 Malignant neoplasm of unspecified part of unspecified bronchus or lung: Secondary | ICD-10-CM | POA: Diagnosis not present

## 2020-04-12 DIAGNOSIS — E785 Hyperlipidemia, unspecified: Secondary | ICD-10-CM | POA: Diagnosis not present

## 2020-04-12 DIAGNOSIS — J449 Chronic obstructive pulmonary disease, unspecified: Secondary | ICD-10-CM | POA: Diagnosis not present

## 2020-04-12 DIAGNOSIS — C3482 Malignant neoplasm of overlapping sites of left bronchus and lung: Secondary | ICD-10-CM | POA: Diagnosis not present

## 2020-04-12 DIAGNOSIS — Z87891 Personal history of nicotine dependence: Secondary | ICD-10-CM | POA: Diagnosis not present

## 2020-04-12 DIAGNOSIS — Z79899 Other long term (current) drug therapy: Secondary | ICD-10-CM | POA: Diagnosis not present

## 2020-04-13 DIAGNOSIS — Z87891 Personal history of nicotine dependence: Secondary | ICD-10-CM | POA: Diagnosis not present

## 2020-04-13 DIAGNOSIS — J449 Chronic obstructive pulmonary disease, unspecified: Secondary | ICD-10-CM | POA: Diagnosis not present

## 2020-04-13 DIAGNOSIS — C3490 Malignant neoplasm of unspecified part of unspecified bronchus or lung: Secondary | ICD-10-CM | POA: Diagnosis not present

## 2020-04-13 DIAGNOSIS — E785 Hyperlipidemia, unspecified: Secondary | ICD-10-CM | POA: Diagnosis not present

## 2020-04-13 DIAGNOSIS — C3482 Malignant neoplasm of overlapping sites of left bronchus and lung: Secondary | ICD-10-CM | POA: Diagnosis not present

## 2020-04-13 DIAGNOSIS — Z79899 Other long term (current) drug therapy: Secondary | ICD-10-CM | POA: Diagnosis not present

## 2020-04-14 DIAGNOSIS — Z79899 Other long term (current) drug therapy: Secondary | ICD-10-CM | POA: Diagnosis not present

## 2020-04-14 DIAGNOSIS — Z87891 Personal history of nicotine dependence: Secondary | ICD-10-CM | POA: Diagnosis not present

## 2020-04-14 DIAGNOSIS — E785 Hyperlipidemia, unspecified: Secondary | ICD-10-CM | POA: Diagnosis not present

## 2020-04-14 DIAGNOSIS — C3490 Malignant neoplasm of unspecified part of unspecified bronchus or lung: Secondary | ICD-10-CM | POA: Diagnosis not present

## 2020-04-14 DIAGNOSIS — C3482 Malignant neoplasm of overlapping sites of left bronchus and lung: Secondary | ICD-10-CM | POA: Diagnosis not present

## 2020-04-14 DIAGNOSIS — J449 Chronic obstructive pulmonary disease, unspecified: Secondary | ICD-10-CM | POA: Diagnosis not present

## 2020-04-15 DIAGNOSIS — C3490 Malignant neoplasm of unspecified part of unspecified bronchus or lung: Secondary | ICD-10-CM | POA: Diagnosis not present

## 2020-04-15 DIAGNOSIS — C3482 Malignant neoplasm of overlapping sites of left bronchus and lung: Secondary | ICD-10-CM | POA: Diagnosis not present

## 2020-04-15 DIAGNOSIS — J449 Chronic obstructive pulmonary disease, unspecified: Secondary | ICD-10-CM | POA: Diagnosis not present

## 2020-04-15 DIAGNOSIS — Z79899 Other long term (current) drug therapy: Secondary | ICD-10-CM | POA: Diagnosis not present

## 2020-04-15 DIAGNOSIS — Z87891 Personal history of nicotine dependence: Secondary | ICD-10-CM | POA: Diagnosis not present

## 2020-04-15 DIAGNOSIS — E785 Hyperlipidemia, unspecified: Secondary | ICD-10-CM | POA: Diagnosis not present

## 2020-04-16 DIAGNOSIS — J449 Chronic obstructive pulmonary disease, unspecified: Secondary | ICD-10-CM | POA: Diagnosis not present

## 2020-04-16 DIAGNOSIS — C3490 Malignant neoplasm of unspecified part of unspecified bronchus or lung: Secondary | ICD-10-CM | POA: Diagnosis not present

## 2020-04-16 DIAGNOSIS — Z79899 Other long term (current) drug therapy: Secondary | ICD-10-CM | POA: Diagnosis not present

## 2020-04-16 DIAGNOSIS — E785 Hyperlipidemia, unspecified: Secondary | ICD-10-CM | POA: Diagnosis not present

## 2020-04-16 DIAGNOSIS — C3482 Malignant neoplasm of overlapping sites of left bronchus and lung: Secondary | ICD-10-CM | POA: Diagnosis not present

## 2020-04-16 DIAGNOSIS — Z87891 Personal history of nicotine dependence: Secondary | ICD-10-CM | POA: Diagnosis not present

## 2020-04-19 DIAGNOSIS — E785 Hyperlipidemia, unspecified: Secondary | ICD-10-CM | POA: Diagnosis not present

## 2020-04-19 DIAGNOSIS — J449 Chronic obstructive pulmonary disease, unspecified: Secondary | ICD-10-CM | POA: Diagnosis not present

## 2020-04-19 DIAGNOSIS — Z79899 Other long term (current) drug therapy: Secondary | ICD-10-CM | POA: Diagnosis not present

## 2020-04-19 DIAGNOSIS — Z87891 Personal history of nicotine dependence: Secondary | ICD-10-CM | POA: Diagnosis not present

## 2020-04-19 DIAGNOSIS — C3482 Malignant neoplasm of overlapping sites of left bronchus and lung: Secondary | ICD-10-CM | POA: Diagnosis not present

## 2020-04-19 DIAGNOSIS — C3490 Malignant neoplasm of unspecified part of unspecified bronchus or lung: Secondary | ICD-10-CM | POA: Diagnosis not present

## 2020-04-20 DIAGNOSIS — J449 Chronic obstructive pulmonary disease, unspecified: Secondary | ICD-10-CM | POA: Diagnosis not present

## 2020-04-20 DIAGNOSIS — C3490 Malignant neoplasm of unspecified part of unspecified bronchus or lung: Secondary | ICD-10-CM | POA: Diagnosis not present

## 2020-04-20 DIAGNOSIS — Z5111 Encounter for antineoplastic chemotherapy: Secondary | ICD-10-CM | POA: Diagnosis not present

## 2020-04-20 DIAGNOSIS — C3482 Malignant neoplasm of overlapping sites of left bronchus and lung: Secondary | ICD-10-CM | POA: Diagnosis not present

## 2020-04-20 DIAGNOSIS — Z87891 Personal history of nicotine dependence: Secondary | ICD-10-CM | POA: Diagnosis not present

## 2020-04-20 DIAGNOSIS — E785 Hyperlipidemia, unspecified: Secondary | ICD-10-CM | POA: Diagnosis not present

## 2020-04-20 DIAGNOSIS — Z79899 Other long term (current) drug therapy: Secondary | ICD-10-CM | POA: Diagnosis not present

## 2020-04-21 DIAGNOSIS — J449 Chronic obstructive pulmonary disease, unspecified: Secondary | ICD-10-CM | POA: Diagnosis not present

## 2020-04-21 DIAGNOSIS — C3490 Malignant neoplasm of unspecified part of unspecified bronchus or lung: Secondary | ICD-10-CM | POA: Diagnosis not present

## 2020-04-21 DIAGNOSIS — Z87891 Personal history of nicotine dependence: Secondary | ICD-10-CM | POA: Diagnosis not present

## 2020-04-21 DIAGNOSIS — Z79899 Other long term (current) drug therapy: Secondary | ICD-10-CM | POA: Diagnosis not present

## 2020-04-21 DIAGNOSIS — E785 Hyperlipidemia, unspecified: Secondary | ICD-10-CM | POA: Diagnosis not present

## 2020-04-21 DIAGNOSIS — C3482 Malignant neoplasm of overlapping sites of left bronchus and lung: Secondary | ICD-10-CM | POA: Diagnosis not present

## 2020-04-22 DIAGNOSIS — J449 Chronic obstructive pulmonary disease, unspecified: Secondary | ICD-10-CM | POA: Diagnosis not present

## 2020-04-22 DIAGNOSIS — Z87891 Personal history of nicotine dependence: Secondary | ICD-10-CM | POA: Diagnosis not present

## 2020-04-22 DIAGNOSIS — C3482 Malignant neoplasm of overlapping sites of left bronchus and lung: Secondary | ICD-10-CM | POA: Diagnosis not present

## 2020-04-22 DIAGNOSIS — E785 Hyperlipidemia, unspecified: Secondary | ICD-10-CM | POA: Diagnosis not present

## 2020-04-22 DIAGNOSIS — C3490 Malignant neoplasm of unspecified part of unspecified bronchus or lung: Secondary | ICD-10-CM | POA: Diagnosis not present

## 2020-04-22 DIAGNOSIS — Z79899 Other long term (current) drug therapy: Secondary | ICD-10-CM | POA: Diagnosis not present

## 2020-04-23 DIAGNOSIS — E785 Hyperlipidemia, unspecified: Secondary | ICD-10-CM | POA: Diagnosis not present

## 2020-04-23 DIAGNOSIS — J449 Chronic obstructive pulmonary disease, unspecified: Secondary | ICD-10-CM | POA: Diagnosis not present

## 2020-04-23 DIAGNOSIS — Z79899 Other long term (current) drug therapy: Secondary | ICD-10-CM | POA: Diagnosis not present

## 2020-04-23 DIAGNOSIS — C3490 Malignant neoplasm of unspecified part of unspecified bronchus or lung: Secondary | ICD-10-CM | POA: Diagnosis not present

## 2020-04-23 DIAGNOSIS — C3482 Malignant neoplasm of overlapping sites of left bronchus and lung: Secondary | ICD-10-CM | POA: Diagnosis not present

## 2020-04-23 DIAGNOSIS — Z87891 Personal history of nicotine dependence: Secondary | ICD-10-CM | POA: Diagnosis not present

## 2020-04-26 DIAGNOSIS — J449 Chronic obstructive pulmonary disease, unspecified: Secondary | ICD-10-CM | POA: Diagnosis not present

## 2020-04-26 DIAGNOSIS — C3490 Malignant neoplasm of unspecified part of unspecified bronchus or lung: Secondary | ICD-10-CM | POA: Diagnosis not present

## 2020-04-26 DIAGNOSIS — Z79899 Other long term (current) drug therapy: Secondary | ICD-10-CM | POA: Diagnosis not present

## 2020-04-26 DIAGNOSIS — Z87891 Personal history of nicotine dependence: Secondary | ICD-10-CM | POA: Diagnosis not present

## 2020-04-26 DIAGNOSIS — C3482 Malignant neoplasm of overlapping sites of left bronchus and lung: Secondary | ICD-10-CM | POA: Diagnosis not present

## 2020-04-26 DIAGNOSIS — E785 Hyperlipidemia, unspecified: Secondary | ICD-10-CM | POA: Diagnosis not present

## 2020-04-27 DIAGNOSIS — C3490 Malignant neoplasm of unspecified part of unspecified bronchus or lung: Secondary | ICD-10-CM | POA: Diagnosis not present

## 2020-04-27 DIAGNOSIS — E785 Hyperlipidemia, unspecified: Secondary | ICD-10-CM | POA: Diagnosis not present

## 2020-04-27 DIAGNOSIS — C3482 Malignant neoplasm of overlapping sites of left bronchus and lung: Secondary | ICD-10-CM | POA: Diagnosis not present

## 2020-04-27 DIAGNOSIS — Z79899 Other long term (current) drug therapy: Secondary | ICD-10-CM | POA: Diagnosis not present

## 2020-04-27 DIAGNOSIS — Z87891 Personal history of nicotine dependence: Secondary | ICD-10-CM | POA: Diagnosis not present

## 2020-04-27 DIAGNOSIS — J449 Chronic obstructive pulmonary disease, unspecified: Secondary | ICD-10-CM | POA: Diagnosis not present

## 2020-04-28 DIAGNOSIS — E785 Hyperlipidemia, unspecified: Secondary | ICD-10-CM | POA: Diagnosis not present

## 2020-04-28 DIAGNOSIS — C3482 Malignant neoplasm of overlapping sites of left bronchus and lung: Secondary | ICD-10-CM | POA: Diagnosis not present

## 2020-04-28 DIAGNOSIS — C3490 Malignant neoplasm of unspecified part of unspecified bronchus or lung: Secondary | ICD-10-CM | POA: Diagnosis not present

## 2020-04-28 DIAGNOSIS — Z79899 Other long term (current) drug therapy: Secondary | ICD-10-CM | POA: Diagnosis not present

## 2020-04-28 DIAGNOSIS — J449 Chronic obstructive pulmonary disease, unspecified: Secondary | ICD-10-CM | POA: Diagnosis not present

## 2020-04-28 DIAGNOSIS — Z87891 Personal history of nicotine dependence: Secondary | ICD-10-CM | POA: Diagnosis not present

## 2020-04-29 DIAGNOSIS — Z79899 Other long term (current) drug therapy: Secondary | ICD-10-CM | POA: Diagnosis not present

## 2020-04-29 DIAGNOSIS — C3482 Malignant neoplasm of overlapping sites of left bronchus and lung: Secondary | ICD-10-CM | POA: Diagnosis not present

## 2020-04-29 DIAGNOSIS — C3490 Malignant neoplasm of unspecified part of unspecified bronchus or lung: Secondary | ICD-10-CM | POA: Diagnosis not present

## 2020-04-29 DIAGNOSIS — E785 Hyperlipidemia, unspecified: Secondary | ICD-10-CM | POA: Diagnosis not present

## 2020-04-29 DIAGNOSIS — J449 Chronic obstructive pulmonary disease, unspecified: Secondary | ICD-10-CM | POA: Diagnosis not present

## 2020-04-29 DIAGNOSIS — Z87891 Personal history of nicotine dependence: Secondary | ICD-10-CM | POA: Diagnosis not present

## 2020-04-30 DIAGNOSIS — E785 Hyperlipidemia, unspecified: Secondary | ICD-10-CM | POA: Diagnosis not present

## 2020-04-30 DIAGNOSIS — Z79899 Other long term (current) drug therapy: Secondary | ICD-10-CM | POA: Diagnosis not present

## 2020-04-30 DIAGNOSIS — J449 Chronic obstructive pulmonary disease, unspecified: Secondary | ICD-10-CM | POA: Diagnosis not present

## 2020-04-30 DIAGNOSIS — Z87891 Personal history of nicotine dependence: Secondary | ICD-10-CM | POA: Diagnosis not present

## 2020-04-30 DIAGNOSIS — C3490 Malignant neoplasm of unspecified part of unspecified bronchus or lung: Secondary | ICD-10-CM | POA: Diagnosis not present

## 2020-04-30 DIAGNOSIS — C3482 Malignant neoplasm of overlapping sites of left bronchus and lung: Secondary | ICD-10-CM | POA: Diagnosis not present

## 2020-05-03 DIAGNOSIS — C3482 Malignant neoplasm of overlapping sites of left bronchus and lung: Secondary | ICD-10-CM | POA: Diagnosis not present

## 2020-05-03 DIAGNOSIS — C3490 Malignant neoplasm of unspecified part of unspecified bronchus or lung: Secondary | ICD-10-CM | POA: Diagnosis not present

## 2020-05-03 DIAGNOSIS — Z79899 Other long term (current) drug therapy: Secondary | ICD-10-CM | POA: Diagnosis not present

## 2020-05-03 DIAGNOSIS — Z87891 Personal history of nicotine dependence: Secondary | ICD-10-CM | POA: Diagnosis not present

## 2020-05-03 DIAGNOSIS — J449 Chronic obstructive pulmonary disease, unspecified: Secondary | ICD-10-CM | POA: Diagnosis not present

## 2020-05-03 DIAGNOSIS — E785 Hyperlipidemia, unspecified: Secondary | ICD-10-CM | POA: Diagnosis not present

## 2020-05-04 DIAGNOSIS — C3490 Malignant neoplasm of unspecified part of unspecified bronchus or lung: Secondary | ICD-10-CM | POA: Diagnosis not present

## 2020-05-04 DIAGNOSIS — Z789 Other specified health status: Secondary | ICD-10-CM | POA: Diagnosis not present

## 2020-05-04 DIAGNOSIS — M5414 Radiculopathy, thoracic region: Secondary | ICD-10-CM | POA: Diagnosis not present

## 2020-05-04 DIAGNOSIS — M546 Pain in thoracic spine: Secondary | ICD-10-CM | POA: Diagnosis not present

## 2020-05-04 DIAGNOSIS — Z79899 Other long term (current) drug therapy: Secondary | ICD-10-CM | POA: Diagnosis not present

## 2020-05-04 DIAGNOSIS — J449 Chronic obstructive pulmonary disease, unspecified: Secondary | ICD-10-CM | POA: Diagnosis not present

## 2020-05-04 DIAGNOSIS — C3482 Malignant neoplasm of overlapping sites of left bronchus and lung: Secondary | ICD-10-CM | POA: Diagnosis not present

## 2020-05-04 DIAGNOSIS — Z87891 Personal history of nicotine dependence: Secondary | ICD-10-CM | POA: Diagnosis not present

## 2020-05-04 DIAGNOSIS — E785 Hyperlipidemia, unspecified: Secondary | ICD-10-CM | POA: Diagnosis not present

## 2020-05-05 DIAGNOSIS — Z87891 Personal history of nicotine dependence: Secondary | ICD-10-CM | POA: Diagnosis not present

## 2020-05-05 DIAGNOSIS — C3482 Malignant neoplasm of overlapping sites of left bronchus and lung: Secondary | ICD-10-CM | POA: Diagnosis not present

## 2020-05-05 DIAGNOSIS — C3490 Malignant neoplasm of unspecified part of unspecified bronchus or lung: Secondary | ICD-10-CM | POA: Diagnosis not present

## 2020-05-05 DIAGNOSIS — Z79899 Other long term (current) drug therapy: Secondary | ICD-10-CM | POA: Diagnosis not present

## 2020-05-05 DIAGNOSIS — J449 Chronic obstructive pulmonary disease, unspecified: Secondary | ICD-10-CM | POA: Diagnosis not present

## 2020-05-05 DIAGNOSIS — E785 Hyperlipidemia, unspecified: Secondary | ICD-10-CM | POA: Diagnosis not present

## 2020-05-06 DIAGNOSIS — C3490 Malignant neoplasm of unspecified part of unspecified bronchus or lung: Secondary | ICD-10-CM | POA: Diagnosis not present

## 2020-05-06 DIAGNOSIS — Z79899 Other long term (current) drug therapy: Secondary | ICD-10-CM | POA: Diagnosis not present

## 2020-05-06 DIAGNOSIS — C3482 Malignant neoplasm of overlapping sites of left bronchus and lung: Secondary | ICD-10-CM | POA: Diagnosis not present

## 2020-05-06 DIAGNOSIS — E785 Hyperlipidemia, unspecified: Secondary | ICD-10-CM | POA: Diagnosis not present

## 2020-05-06 DIAGNOSIS — J449 Chronic obstructive pulmonary disease, unspecified: Secondary | ICD-10-CM | POA: Diagnosis not present

## 2020-05-06 DIAGNOSIS — Z87891 Personal history of nicotine dependence: Secondary | ICD-10-CM | POA: Diagnosis not present

## 2020-05-07 DIAGNOSIS — J449 Chronic obstructive pulmonary disease, unspecified: Secondary | ICD-10-CM | POA: Diagnosis not present

## 2020-05-07 DIAGNOSIS — Z79899 Other long term (current) drug therapy: Secondary | ICD-10-CM | POA: Diagnosis not present

## 2020-05-07 DIAGNOSIS — Z87891 Personal history of nicotine dependence: Secondary | ICD-10-CM | POA: Diagnosis not present

## 2020-05-07 DIAGNOSIS — C3482 Malignant neoplasm of overlapping sites of left bronchus and lung: Secondary | ICD-10-CM | POA: Diagnosis not present

## 2020-05-07 DIAGNOSIS — E785 Hyperlipidemia, unspecified: Secondary | ICD-10-CM | POA: Diagnosis not present

## 2020-05-07 DIAGNOSIS — C3490 Malignant neoplasm of unspecified part of unspecified bronchus or lung: Secondary | ICD-10-CM | POA: Diagnosis not present

## 2020-05-10 DIAGNOSIS — Z79899 Other long term (current) drug therapy: Secondary | ICD-10-CM | POA: Diagnosis not present

## 2020-05-10 DIAGNOSIS — E785 Hyperlipidemia, unspecified: Secondary | ICD-10-CM | POA: Diagnosis not present

## 2020-05-10 DIAGNOSIS — C3490 Malignant neoplasm of unspecified part of unspecified bronchus or lung: Secondary | ICD-10-CM | POA: Diagnosis not present

## 2020-05-10 DIAGNOSIS — J449 Chronic obstructive pulmonary disease, unspecified: Secondary | ICD-10-CM | POA: Diagnosis not present

## 2020-05-10 DIAGNOSIS — Z87891 Personal history of nicotine dependence: Secondary | ICD-10-CM | POA: Diagnosis not present

## 2020-05-10 DIAGNOSIS — C3482 Malignant neoplasm of overlapping sites of left bronchus and lung: Secondary | ICD-10-CM | POA: Diagnosis not present

## 2020-05-11 DIAGNOSIS — J449 Chronic obstructive pulmonary disease, unspecified: Secondary | ICD-10-CM | POA: Diagnosis not present

## 2020-05-11 DIAGNOSIS — Z79899 Other long term (current) drug therapy: Secondary | ICD-10-CM | POA: Diagnosis not present

## 2020-05-11 DIAGNOSIS — C771 Secondary and unspecified malignant neoplasm of intrathoracic lymph nodes: Secondary | ICD-10-CM | POA: Diagnosis not present

## 2020-05-11 DIAGNOSIS — Z801 Family history of malignant neoplasm of trachea, bronchus and lung: Secondary | ICD-10-CM | POA: Diagnosis not present

## 2020-05-11 DIAGNOSIS — C3490 Malignant neoplasm of unspecified part of unspecified bronchus or lung: Secondary | ICD-10-CM | POA: Diagnosis not present

## 2020-05-11 DIAGNOSIS — Z7951 Long term (current) use of inhaled steroids: Secondary | ICD-10-CM | POA: Diagnosis not present

## 2020-05-11 DIAGNOSIS — Z87891 Personal history of nicotine dependence: Secondary | ICD-10-CM | POA: Diagnosis not present

## 2020-05-11 DIAGNOSIS — E785 Hyperlipidemia, unspecified: Secondary | ICD-10-CM | POA: Diagnosis not present

## 2020-05-11 DIAGNOSIS — D61818 Other pancytopenia: Secondary | ICD-10-CM | POA: Diagnosis not present

## 2020-05-14 DIAGNOSIS — Z7951 Long term (current) use of inhaled steroids: Secondary | ICD-10-CM | POA: Diagnosis not present

## 2020-05-14 DIAGNOSIS — K208 Other esophagitis without bleeding: Secondary | ICD-10-CM | POA: Diagnosis not present

## 2020-05-14 DIAGNOSIS — G2581 Restless legs syndrome: Secondary | ICD-10-CM | POA: Diagnosis not present

## 2020-05-14 DIAGNOSIS — J90 Pleural effusion, not elsewhere classified: Secondary | ICD-10-CM | POA: Diagnosis not present

## 2020-05-14 DIAGNOSIS — D630 Anemia in neoplastic disease: Secondary | ICD-10-CM | POA: Diagnosis not present

## 2020-05-14 DIAGNOSIS — T66XXXA Radiation sickness, unspecified, initial encounter: Secondary | ICD-10-CM | POA: Diagnosis not present

## 2020-05-14 DIAGNOSIS — I2694 Multiple subsegmental pulmonary emboli without acute cor pulmonale: Secondary | ICD-10-CM | POA: Diagnosis not present

## 2020-05-14 DIAGNOSIS — C3490 Malignant neoplasm of unspecified part of unspecified bronchus or lung: Secondary | ICD-10-CM | POA: Diagnosis not present

## 2020-05-14 DIAGNOSIS — Z20822 Contact with and (suspected) exposure to covid-19: Secondary | ICD-10-CM | POA: Diagnosis not present

## 2020-05-14 DIAGNOSIS — C3492 Malignant neoplasm of unspecified part of left bronchus or lung: Secondary | ICD-10-CM | POA: Diagnosis not present

## 2020-05-14 DIAGNOSIS — Z9989 Dependence on other enabling machines and devices: Secondary | ICD-10-CM | POA: Diagnosis not present

## 2020-05-14 DIAGNOSIS — C771 Secondary and unspecified malignant neoplasm of intrathoracic lymph nodes: Secondary | ICD-10-CM | POA: Diagnosis not present

## 2020-05-14 DIAGNOSIS — R0902 Hypoxemia: Secondary | ICD-10-CM | POA: Diagnosis not present

## 2020-05-14 DIAGNOSIS — J449 Chronic obstructive pulmonary disease, unspecified: Secondary | ICD-10-CM | POA: Diagnosis not present

## 2020-05-14 DIAGNOSIS — Z79899 Other long term (current) drug therapy: Secondary | ICD-10-CM | POA: Diagnosis not present

## 2020-05-14 DIAGNOSIS — R079 Chest pain, unspecified: Secondary | ICD-10-CM | POA: Diagnosis not present

## 2020-05-14 DIAGNOSIS — M6250 Muscle wasting and atrophy, not elsewhere classified, unspecified site: Secondary | ICD-10-CM | POA: Diagnosis not present

## 2020-05-14 DIAGNOSIS — D61818 Other pancytopenia: Secondary | ICD-10-CM | POA: Diagnosis not present

## 2020-05-14 DIAGNOSIS — J9811 Atelectasis: Secondary | ICD-10-CM | POA: Diagnosis not present

## 2020-05-14 DIAGNOSIS — I2699 Other pulmonary embolism without acute cor pulmonale: Secondary | ICD-10-CM | POA: Diagnosis not present

## 2020-05-14 DIAGNOSIS — Z743 Need for continuous supervision: Secondary | ICD-10-CM | POA: Diagnosis not present

## 2020-05-14 DIAGNOSIS — E785 Hyperlipidemia, unspecified: Secondary | ICD-10-CM | POA: Diagnosis not present

## 2020-05-14 DIAGNOSIS — Z9221 Personal history of antineoplastic chemotherapy: Secondary | ICD-10-CM | POA: Diagnosis not present

## 2020-05-15 DIAGNOSIS — I517 Cardiomegaly: Secondary | ICD-10-CM | POA: Diagnosis not present

## 2020-05-15 DIAGNOSIS — E785 Hyperlipidemia, unspecified: Secondary | ICD-10-CM | POA: Diagnosis not present

## 2020-05-15 DIAGNOSIS — J90 Pleural effusion, not elsewhere classified: Secondary | ICD-10-CM | POA: Diagnosis not present

## 2020-05-15 DIAGNOSIS — G2581 Restless legs syndrome: Secondary | ICD-10-CM | POA: Diagnosis not present

## 2020-05-15 DIAGNOSIS — K208 Other esophagitis without bleeding: Secondary | ICD-10-CM | POA: Diagnosis not present

## 2020-05-15 DIAGNOSIS — J449 Chronic obstructive pulmonary disease, unspecified: Secondary | ICD-10-CM | POA: Diagnosis not present

## 2020-05-15 DIAGNOSIS — R931 Abnormal findings on diagnostic imaging of heart and coronary circulation: Secondary | ICD-10-CM | POA: Diagnosis not present

## 2020-05-15 DIAGNOSIS — I2694 Multiple subsegmental pulmonary emboli without acute cor pulmonale: Secondary | ICD-10-CM | POA: Diagnosis not present

## 2020-05-15 DIAGNOSIS — I2699 Other pulmonary embolism without acute cor pulmonale: Secondary | ICD-10-CM | POA: Diagnosis not present

## 2020-05-15 DIAGNOSIS — C3490 Malignant neoplasm of unspecified part of unspecified bronchus or lung: Secondary | ICD-10-CM | POA: Diagnosis not present

## 2020-05-15 DIAGNOSIS — C771 Secondary and unspecified malignant neoplasm of intrathoracic lymph nodes: Secondary | ICD-10-CM | POA: Diagnosis not present

## 2020-05-15 DIAGNOSIS — I77810 Thoracic aortic ectasia: Secondary | ICD-10-CM | POA: Diagnosis not present

## 2020-05-15 DIAGNOSIS — D61818 Other pancytopenia: Secondary | ICD-10-CM | POA: Diagnosis not present

## 2020-05-15 DIAGNOSIS — T66XXXA Radiation sickness, unspecified, initial encounter: Secondary | ICD-10-CM | POA: Diagnosis not present

## 2020-05-15 DIAGNOSIS — D630 Anemia in neoplastic disease: Secondary | ICD-10-CM | POA: Diagnosis not present

## 2020-05-20 DIAGNOSIS — R599 Enlarged lymph nodes, unspecified: Secondary | ICD-10-CM | POA: Diagnosis not present

## 2020-05-20 DIAGNOSIS — C3492 Malignant neoplasm of unspecified part of left bronchus or lung: Secondary | ICD-10-CM | POA: Diagnosis not present

## 2020-05-20 DIAGNOSIS — R590 Localized enlarged lymph nodes: Secondary | ICD-10-CM | POA: Diagnosis not present

## 2020-05-20 DIAGNOSIS — E785 Hyperlipidemia, unspecified: Secondary | ICD-10-CM | POA: Diagnosis not present

## 2020-05-20 DIAGNOSIS — Z452 Encounter for adjustment and management of vascular access device: Secondary | ICD-10-CM | POA: Diagnosis not present

## 2020-05-20 DIAGNOSIS — C3490 Malignant neoplasm of unspecified part of unspecified bronchus or lung: Secondary | ICD-10-CM | POA: Diagnosis not present

## 2020-05-20 DIAGNOSIS — J449 Chronic obstructive pulmonary disease, unspecified: Secondary | ICD-10-CM | POA: Diagnosis not present

## 2020-05-20 DIAGNOSIS — J90 Pleural effusion, not elsewhere classified: Secondary | ICD-10-CM | POA: Diagnosis not present

## 2020-05-25 DIAGNOSIS — D649 Anemia, unspecified: Secondary | ICD-10-CM | POA: Diagnosis not present

## 2020-05-25 DIAGNOSIS — D61818 Other pancytopenia: Secondary | ICD-10-CM | POA: Diagnosis not present

## 2020-05-25 DIAGNOSIS — C3490 Malignant neoplasm of unspecified part of unspecified bronchus or lung: Secondary | ICD-10-CM | POA: Diagnosis not present

## 2020-05-25 DIAGNOSIS — J449 Chronic obstructive pulmonary disease, unspecified: Secondary | ICD-10-CM | POA: Diagnosis not present

## 2020-05-25 DIAGNOSIS — D630 Anemia in neoplastic disease: Secondary | ICD-10-CM | POA: Diagnosis not present

## 2020-05-25 DIAGNOSIS — Z87891 Personal history of nicotine dependence: Secondary | ICD-10-CM | POA: Diagnosis not present

## 2020-05-25 DIAGNOSIS — I2699 Other pulmonary embolism without acute cor pulmonale: Secondary | ICD-10-CM | POA: Diagnosis not present

## 2020-05-25 DIAGNOSIS — Z7901 Long term (current) use of anticoagulants: Secondary | ICD-10-CM | POA: Diagnosis not present

## 2020-05-25 DIAGNOSIS — Z79899 Other long term (current) drug therapy: Secondary | ICD-10-CM | POA: Diagnosis not present

## 2020-05-25 DIAGNOSIS — Z7951 Long term (current) use of inhaled steroids: Secondary | ICD-10-CM | POA: Diagnosis not present

## 2020-05-25 DIAGNOSIS — E785 Hyperlipidemia, unspecified: Secondary | ICD-10-CM | POA: Diagnosis not present

## 2020-05-28 DIAGNOSIS — C3490 Malignant neoplasm of unspecified part of unspecified bronchus or lung: Secondary | ICD-10-CM | POA: Diagnosis not present

## 2020-05-28 DIAGNOSIS — E876 Hypokalemia: Secondary | ICD-10-CM | POA: Diagnosis not present

## 2020-05-28 DIAGNOSIS — D649 Anemia, unspecified: Secondary | ICD-10-CM | POA: Diagnosis not present

## 2020-05-31 DIAGNOSIS — I2699 Other pulmonary embolism without acute cor pulmonale: Secondary | ICD-10-CM | POA: Diagnosis not present

## 2020-05-31 DIAGNOSIS — Z79899 Other long term (current) drug therapy: Secondary | ICD-10-CM | POA: Diagnosis not present

## 2020-05-31 DIAGNOSIS — D649 Anemia, unspecified: Secondary | ICD-10-CM | POA: Diagnosis not present

## 2020-05-31 DIAGNOSIS — K208 Other esophagitis without bleeding: Secondary | ICD-10-CM | POA: Diagnosis not present

## 2020-05-31 DIAGNOSIS — Z6825 Body mass index (BMI) 25.0-25.9, adult: Secondary | ICD-10-CM | POA: Diagnosis not present

## 2020-05-31 DIAGNOSIS — T66XXXA Radiation sickness, unspecified, initial encounter: Secondary | ICD-10-CM | POA: Diagnosis not present

## 2020-05-31 DIAGNOSIS — C3490 Malignant neoplasm of unspecified part of unspecified bronchus or lung: Secondary | ICD-10-CM | POA: Diagnosis not present

## 2020-06-01 DIAGNOSIS — C3490 Malignant neoplasm of unspecified part of unspecified bronchus or lung: Secondary | ICD-10-CM | POA: Diagnosis not present

## 2020-06-02 DIAGNOSIS — D649 Anemia, unspecified: Secondary | ICD-10-CM | POA: Diagnosis not present

## 2020-06-04 DIAGNOSIS — D630 Anemia in neoplastic disease: Secondary | ICD-10-CM | POA: Diagnosis not present

## 2020-06-04 DIAGNOSIS — C3490 Malignant neoplasm of unspecified part of unspecified bronchus or lung: Secondary | ICD-10-CM | POA: Diagnosis not present

## 2020-06-04 DIAGNOSIS — E876 Hypokalemia: Secondary | ICD-10-CM | POA: Diagnosis not present

## 2020-06-08 DIAGNOSIS — E876 Hypokalemia: Secondary | ICD-10-CM | POA: Diagnosis not present

## 2020-06-08 DIAGNOSIS — D630 Anemia in neoplastic disease: Secondary | ICD-10-CM | POA: Diagnosis not present

## 2020-06-08 DIAGNOSIS — C3490 Malignant neoplasm of unspecified part of unspecified bronchus or lung: Secondary | ICD-10-CM | POA: Diagnosis not present

## 2020-06-09 DIAGNOSIS — C3490 Malignant neoplasm of unspecified part of unspecified bronchus or lung: Secondary | ICD-10-CM | POA: Diagnosis not present

## 2020-06-09 DIAGNOSIS — D61818 Other pancytopenia: Secondary | ICD-10-CM | POA: Diagnosis not present

## 2020-06-09 DIAGNOSIS — I2699 Other pulmonary embolism without acute cor pulmonale: Secondary | ICD-10-CM | POA: Diagnosis not present

## 2020-06-09 DIAGNOSIS — D649 Anemia, unspecified: Secondary | ICD-10-CM | POA: Diagnosis not present

## 2020-06-09 DIAGNOSIS — Z23 Encounter for immunization: Secondary | ICD-10-CM | POA: Diagnosis not present

## 2020-06-09 DIAGNOSIS — M5030 Other cervical disc degeneration, unspecified cervical region: Secondary | ICD-10-CM | POA: Diagnosis not present

## 2020-06-09 DIAGNOSIS — D61810 Antineoplastic chemotherapy induced pancytopenia: Secondary | ICD-10-CM | POA: Diagnosis not present

## 2020-06-09 DIAGNOSIS — M546 Pain in thoracic spine: Secondary | ICD-10-CM | POA: Diagnosis not present

## 2020-06-09 DIAGNOSIS — T451X5A Adverse effect of antineoplastic and immunosuppressive drugs, initial encounter: Secondary | ICD-10-CM | POA: Diagnosis not present

## 2020-06-09 DIAGNOSIS — M5414 Radiculopathy, thoracic region: Secondary | ICD-10-CM | POA: Diagnosis not present

## 2020-06-09 DIAGNOSIS — D7589 Other specified diseases of blood and blood-forming organs: Secondary | ICD-10-CM | POA: Diagnosis not present

## 2020-06-11 DIAGNOSIS — D649 Anemia, unspecified: Secondary | ICD-10-CM | POA: Diagnosis not present

## 2020-06-11 DIAGNOSIS — C3490 Malignant neoplasm of unspecified part of unspecified bronchus or lung: Secondary | ICD-10-CM | POA: Diagnosis not present

## 2020-06-15 DIAGNOSIS — E785 Hyperlipidemia, unspecified: Secondary | ICD-10-CM | POA: Diagnosis not present

## 2020-06-15 DIAGNOSIS — J449 Chronic obstructive pulmonary disease, unspecified: Secondary | ICD-10-CM | POA: Diagnosis not present

## 2020-06-15 DIAGNOSIS — C3490 Malignant neoplasm of unspecified part of unspecified bronchus or lung: Secondary | ICD-10-CM | POA: Diagnosis not present

## 2020-06-15 DIAGNOSIS — D649 Anemia, unspecified: Secondary | ICD-10-CM | POA: Diagnosis not present

## 2020-06-15 DIAGNOSIS — Z79899 Other long term (current) drug therapy: Secondary | ICD-10-CM | POA: Diagnosis not present

## 2020-06-15 DIAGNOSIS — Z87891 Personal history of nicotine dependence: Secondary | ICD-10-CM | POA: Diagnosis not present

## 2020-06-18 DIAGNOSIS — E876 Hypokalemia: Secondary | ICD-10-CM | POA: Diagnosis not present

## 2020-06-18 DIAGNOSIS — C3490 Malignant neoplasm of unspecified part of unspecified bronchus or lung: Secondary | ICD-10-CM | POA: Diagnosis not present

## 2020-06-18 DIAGNOSIS — D649 Anemia, unspecified: Secondary | ICD-10-CM | POA: Diagnosis not present

## 2020-06-21 DIAGNOSIS — D61818 Other pancytopenia: Secondary | ICD-10-CM | POA: Diagnosis not present

## 2020-06-24 DIAGNOSIS — M7989 Other specified soft tissue disorders: Secondary | ICD-10-CM | POA: Diagnosis not present

## 2020-06-24 DIAGNOSIS — I2699 Other pulmonary embolism without acute cor pulmonale: Secondary | ICD-10-CM | POA: Diagnosis not present

## 2020-06-24 DIAGNOSIS — Z86711 Personal history of pulmonary embolism: Secondary | ICD-10-CM | POA: Diagnosis not present

## 2020-06-25 DIAGNOSIS — D649 Anemia, unspecified: Secondary | ICD-10-CM | POA: Diagnosis not present

## 2020-06-29 DIAGNOSIS — C3400 Malignant neoplasm of unspecified main bronchus: Secondary | ICD-10-CM | POA: Diagnosis not present

## 2020-06-29 DIAGNOSIS — Z7901 Long term (current) use of anticoagulants: Secondary | ICD-10-CM | POA: Diagnosis not present

## 2020-06-29 DIAGNOSIS — R600 Localized edema: Secondary | ICD-10-CM | POA: Diagnosis not present

## 2020-06-29 DIAGNOSIS — M546 Pain in thoracic spine: Secondary | ICD-10-CM | POA: Diagnosis not present

## 2020-06-29 DIAGNOSIS — J449 Chronic obstructive pulmonary disease, unspecified: Secondary | ICD-10-CM | POA: Diagnosis not present

## 2020-06-29 DIAGNOSIS — Z5112 Encounter for antineoplastic immunotherapy: Secondary | ICD-10-CM | POA: Diagnosis not present

## 2020-06-29 DIAGNOSIS — Z87891 Personal history of nicotine dependence: Secondary | ICD-10-CM | POA: Diagnosis not present

## 2020-06-29 DIAGNOSIS — D61818 Other pancytopenia: Secondary | ICD-10-CM | POA: Diagnosis not present

## 2020-06-29 DIAGNOSIS — C3490 Malignant neoplasm of unspecified part of unspecified bronchus or lung: Secondary | ICD-10-CM | POA: Diagnosis not present

## 2020-06-29 DIAGNOSIS — R0601 Orthopnea: Secondary | ICD-10-CM | POA: Diagnosis not present

## 2020-07-02 DIAGNOSIS — D649 Anemia, unspecified: Secondary | ICD-10-CM | POA: Diagnosis not present

## 2020-07-02 DIAGNOSIS — E876 Hypokalemia: Secondary | ICD-10-CM | POA: Diagnosis not present

## 2020-07-02 DIAGNOSIS — C3490 Malignant neoplasm of unspecified part of unspecified bronchus or lung: Secondary | ICD-10-CM | POA: Diagnosis not present

## 2020-07-13 DIAGNOSIS — D649 Anemia, unspecified: Secondary | ICD-10-CM | POA: Diagnosis not present

## 2020-07-13 DIAGNOSIS — Z7901 Long term (current) use of anticoagulants: Secondary | ICD-10-CM | POA: Diagnosis not present

## 2020-07-13 DIAGNOSIS — R0789 Other chest pain: Secondary | ICD-10-CM | POA: Diagnosis not present

## 2020-07-13 DIAGNOSIS — C3490 Malignant neoplasm of unspecified part of unspecified bronchus or lung: Secondary | ICD-10-CM | POA: Diagnosis not present

## 2020-07-13 DIAGNOSIS — Z888 Allergy status to other drugs, medicaments and biological substances status: Secondary | ICD-10-CM | POA: Diagnosis not present

## 2020-07-13 DIAGNOSIS — E876 Hypokalemia: Secondary | ICD-10-CM | POA: Diagnosis not present

## 2020-07-13 DIAGNOSIS — Z87891 Personal history of nicotine dependence: Secondary | ICD-10-CM | POA: Diagnosis not present

## 2020-07-13 DIAGNOSIS — D61818 Other pancytopenia: Secondary | ICD-10-CM | POA: Diagnosis not present

## 2020-07-13 DIAGNOSIS — R079 Chest pain, unspecified: Secondary | ICD-10-CM | POA: Diagnosis not present

## 2020-07-13 DIAGNOSIS — J449 Chronic obstructive pulmonary disease, unspecified: Secondary | ICD-10-CM | POA: Diagnosis not present

## 2020-07-13 DIAGNOSIS — E785 Hyperlipidemia, unspecified: Secondary | ICD-10-CM | POA: Diagnosis not present

## 2020-07-13 DIAGNOSIS — M546 Pain in thoracic spine: Secondary | ICD-10-CM | POA: Diagnosis not present

## 2020-07-13 DIAGNOSIS — Z8042 Family history of malignant neoplasm of prostate: Secondary | ICD-10-CM | POA: Diagnosis not present

## 2020-07-13 DIAGNOSIS — R600 Localized edema: Secondary | ICD-10-CM | POA: Diagnosis not present

## 2020-07-19 DIAGNOSIS — R0789 Other chest pain: Secondary | ICD-10-CM | POA: Diagnosis not present

## 2020-07-19 DIAGNOSIS — C3490 Malignant neoplasm of unspecified part of unspecified bronchus or lung: Secondary | ICD-10-CM | POA: Diagnosis not present

## 2020-07-19 DIAGNOSIS — J439 Emphysema, unspecified: Secondary | ICD-10-CM | POA: Diagnosis not present

## 2020-07-20 DIAGNOSIS — D649 Anemia, unspecified: Secondary | ICD-10-CM | POA: Diagnosis not present

## 2020-07-29 DIAGNOSIS — J939 Pneumothorax, unspecified: Secondary | ICD-10-CM | POA: Diagnosis not present

## 2020-07-29 DIAGNOSIS — E785 Hyperlipidemia, unspecified: Secondary | ICD-10-CM | POA: Diagnosis not present

## 2020-07-29 DIAGNOSIS — J90 Pleural effusion, not elsewhere classified: Secondary | ICD-10-CM | POA: Diagnosis not present

## 2020-07-29 DIAGNOSIS — Z85118 Personal history of other malignant neoplasm of bronchus and lung: Secondary | ICD-10-CM | POA: Diagnosis not present

## 2020-07-29 DIAGNOSIS — J449 Chronic obstructive pulmonary disease, unspecified: Secondary | ICD-10-CM | POA: Diagnosis not present

## 2020-07-30 DIAGNOSIS — D649 Anemia, unspecified: Secondary | ICD-10-CM | POA: Diagnosis not present

## 2020-07-30 DIAGNOSIS — C3490 Malignant neoplasm of unspecified part of unspecified bronchus or lung: Secondary | ICD-10-CM | POA: Diagnosis not present

## 2020-08-03 DIAGNOSIS — R0600 Dyspnea, unspecified: Secondary | ICD-10-CM | POA: Diagnosis not present

## 2020-08-03 DIAGNOSIS — J90 Pleural effusion, not elsewhere classified: Secondary | ICD-10-CM | POA: Diagnosis not present

## 2020-08-03 DIAGNOSIS — B356 Tinea cruris: Secondary | ICD-10-CM | POA: Diagnosis not present

## 2020-08-03 DIAGNOSIS — E785 Hyperlipidemia, unspecified: Secondary | ICD-10-CM | POA: Diagnosis not present

## 2020-08-03 DIAGNOSIS — C3492 Malignant neoplasm of unspecified part of left bronchus or lung: Secondary | ICD-10-CM | POA: Diagnosis not present

## 2020-08-03 DIAGNOSIS — C779 Secondary and unspecified malignant neoplasm of lymph node, unspecified: Secondary | ICD-10-CM | POA: Diagnosis not present

## 2020-08-03 DIAGNOSIS — C3490 Malignant neoplasm of unspecified part of unspecified bronchus or lung: Secondary | ICD-10-CM | POA: Diagnosis not present

## 2020-08-03 DIAGNOSIS — D61818 Other pancytopenia: Secondary | ICD-10-CM | POA: Diagnosis not present

## 2020-08-03 DIAGNOSIS — Z801 Family history of malignant neoplasm of trachea, bronchus and lung: Secondary | ICD-10-CM | POA: Diagnosis not present

## 2020-08-03 DIAGNOSIS — Z87891 Personal history of nicotine dependence: Secondary | ICD-10-CM | POA: Diagnosis not present

## 2020-08-03 DIAGNOSIS — J984 Other disorders of lung: Secondary | ICD-10-CM | POA: Diagnosis not present

## 2020-08-03 DIAGNOSIS — J449 Chronic obstructive pulmonary disease, unspecified: Secondary | ICD-10-CM | POA: Diagnosis not present

## 2020-08-03 DIAGNOSIS — Z5112 Encounter for antineoplastic immunotherapy: Secondary | ICD-10-CM | POA: Diagnosis not present

## 2020-08-06 DIAGNOSIS — C3490 Malignant neoplasm of unspecified part of unspecified bronchus or lung: Secondary | ICD-10-CM | POA: Diagnosis not present

## 2020-08-06 DIAGNOSIS — I313 Pericardial effusion (noninflammatory): Secondary | ICD-10-CM | POA: Diagnosis not present

## 2020-08-06 DIAGNOSIS — I517 Cardiomegaly: Secondary | ICD-10-CM | POA: Diagnosis not present

## 2020-08-06 DIAGNOSIS — R0600 Dyspnea, unspecified: Secondary | ICD-10-CM | POA: Diagnosis not present

## 2020-08-17 DIAGNOSIS — Z7901 Long term (current) use of anticoagulants: Secondary | ICD-10-CM | POA: Diagnosis not present

## 2020-08-17 DIAGNOSIS — D61818 Other pancytopenia: Secondary | ICD-10-CM | POA: Diagnosis not present

## 2020-08-17 DIAGNOSIS — Z87891 Personal history of nicotine dependence: Secondary | ICD-10-CM | POA: Diagnosis not present

## 2020-08-17 DIAGNOSIS — J449 Chronic obstructive pulmonary disease, unspecified: Secondary | ICD-10-CM | POA: Diagnosis not present

## 2020-08-17 DIAGNOSIS — C771 Secondary and unspecified malignant neoplasm of intrathoracic lymph nodes: Secondary | ICD-10-CM | POA: Diagnosis not present

## 2020-08-17 DIAGNOSIS — C3492 Malignant neoplasm of unspecified part of left bronchus or lung: Secondary | ICD-10-CM | POA: Diagnosis not present

## 2020-08-17 DIAGNOSIS — E785 Hyperlipidemia, unspecified: Secondary | ICD-10-CM | POA: Diagnosis not present

## 2020-08-17 DIAGNOSIS — R21 Rash and other nonspecific skin eruption: Secondary | ICD-10-CM | POA: Diagnosis not present

## 2020-08-17 DIAGNOSIS — Z923 Personal history of irradiation: Secondary | ICD-10-CM | POA: Diagnosis not present

## 2020-08-17 DIAGNOSIS — C3490 Malignant neoplasm of unspecified part of unspecified bronchus or lung: Secondary | ICD-10-CM | POA: Diagnosis not present

## 2020-08-19 DIAGNOSIS — N400 Enlarged prostate without lower urinary tract symptoms: Secondary | ICD-10-CM | POA: Diagnosis not present

## 2020-08-19 DIAGNOSIS — C3490 Malignant neoplasm of unspecified part of unspecified bronchus or lung: Secondary | ICD-10-CM | POA: Diagnosis not present

## 2020-08-31 DIAGNOSIS — D649 Anemia, unspecified: Secondary | ICD-10-CM | POA: Diagnosis not present

## 2020-08-31 DIAGNOSIS — E876 Hypokalemia: Secondary | ICD-10-CM | POA: Diagnosis not present

## 2020-08-31 DIAGNOSIS — C3490 Malignant neoplasm of unspecified part of unspecified bronchus or lung: Secondary | ICD-10-CM | POA: Diagnosis not present

## 2020-09-14 DIAGNOSIS — E876 Hypokalemia: Secondary | ICD-10-CM | POA: Diagnosis not present

## 2020-09-14 DIAGNOSIS — D630 Anemia in neoplastic disease: Secondary | ICD-10-CM | POA: Diagnosis not present

## 2020-09-14 DIAGNOSIS — C3490 Malignant neoplasm of unspecified part of unspecified bronchus or lung: Secondary | ICD-10-CM | POA: Diagnosis not present

## 2020-09-20 DIAGNOSIS — I700 Atherosclerosis of aorta: Secondary | ICD-10-CM | POA: Diagnosis not present

## 2020-09-20 DIAGNOSIS — Z79899 Other long term (current) drug therapy: Secondary | ICD-10-CM | POA: Diagnosis not present

## 2020-09-20 DIAGNOSIS — C3490 Malignant neoplasm of unspecified part of unspecified bronchus or lung: Secondary | ICD-10-CM | POA: Diagnosis not present

## 2020-09-20 DIAGNOSIS — R7303 Prediabetes: Secondary | ICD-10-CM | POA: Diagnosis not present

## 2020-09-20 DIAGNOSIS — M5030 Other cervical disc degeneration, unspecified cervical region: Secondary | ICD-10-CM | POA: Diagnosis not present

## 2020-09-20 DIAGNOSIS — M5412 Radiculopathy, cervical region: Secondary | ICD-10-CM | POA: Diagnosis not present

## 2020-09-20 DIAGNOSIS — I2699 Other pulmonary embolism without acute cor pulmonale: Secondary | ICD-10-CM | POA: Diagnosis not present

## 2020-09-20 DIAGNOSIS — I10 Essential (primary) hypertension: Secondary | ICD-10-CM | POA: Diagnosis not present

## 2020-09-20 DIAGNOSIS — E7800 Pure hypercholesterolemia, unspecified: Secondary | ICD-10-CM | POA: Diagnosis not present

## 2020-09-24 DIAGNOSIS — J90 Pleural effusion, not elsewhere classified: Secondary | ICD-10-CM | POA: Diagnosis not present

## 2020-09-24 DIAGNOSIS — R911 Solitary pulmonary nodule: Secondary | ICD-10-CM | POA: Diagnosis not present

## 2020-09-24 DIAGNOSIS — C3490 Malignant neoplasm of unspecified part of unspecified bronchus or lung: Secondary | ICD-10-CM | POA: Diagnosis not present

## 2020-09-24 DIAGNOSIS — M799 Soft tissue disorder, unspecified: Secondary | ICD-10-CM | POA: Diagnosis not present

## 2020-09-28 DIAGNOSIS — E785 Hyperlipidemia, unspecified: Secondary | ICD-10-CM | POA: Diagnosis not present

## 2020-09-28 DIAGNOSIS — C3490 Malignant neoplasm of unspecified part of unspecified bronchus or lung: Secondary | ICD-10-CM | POA: Diagnosis not present

## 2020-09-28 DIAGNOSIS — Z5112 Encounter for antineoplastic immunotherapy: Secondary | ICD-10-CM | POA: Diagnosis not present

## 2020-09-28 DIAGNOSIS — Z87891 Personal history of nicotine dependence: Secondary | ICD-10-CM | POA: Diagnosis not present

## 2020-09-28 DIAGNOSIS — J449 Chronic obstructive pulmonary disease, unspecified: Secondary | ICD-10-CM | POA: Diagnosis not present

## 2020-10-12 DIAGNOSIS — E785 Hyperlipidemia, unspecified: Secondary | ICD-10-CM | POA: Diagnosis not present

## 2020-10-12 DIAGNOSIS — E039 Hypothyroidism, unspecified: Secondary | ICD-10-CM | POA: Diagnosis not present

## 2020-10-12 DIAGNOSIS — Z79899 Other long term (current) drug therapy: Secondary | ICD-10-CM | POA: Diagnosis not present

## 2020-10-12 DIAGNOSIS — R5383 Other fatigue: Secondary | ICD-10-CM | POA: Diagnosis not present

## 2020-10-12 DIAGNOSIS — C3490 Malignant neoplasm of unspecified part of unspecified bronchus or lung: Secondary | ICD-10-CM | POA: Diagnosis not present

## 2020-10-12 DIAGNOSIS — Z5111 Encounter for antineoplastic chemotherapy: Secondary | ICD-10-CM | POA: Diagnosis not present

## 2020-10-12 DIAGNOSIS — J449 Chronic obstructive pulmonary disease, unspecified: Secondary | ICD-10-CM | POA: Diagnosis not present

## 2020-10-12 DIAGNOSIS — Z87891 Personal history of nicotine dependence: Secondary | ICD-10-CM | POA: Diagnosis not present

## 2020-10-12 DIAGNOSIS — C3492 Malignant neoplasm of unspecified part of left bronchus or lung: Secondary | ICD-10-CM | POA: Diagnosis not present

## 2020-10-12 DIAGNOSIS — Z7901 Long term (current) use of anticoagulants: Secondary | ICD-10-CM | POA: Diagnosis not present

## 2020-10-13 DIAGNOSIS — M542 Cervicalgia: Secondary | ICD-10-CM | POA: Diagnosis not present

## 2020-10-13 DIAGNOSIS — M5412 Radiculopathy, cervical region: Secondary | ICD-10-CM | POA: Diagnosis not present

## 2020-10-13 DIAGNOSIS — M9931 Osseous stenosis of neural canal of cervical region: Secondary | ICD-10-CM | POA: Diagnosis not present

## 2020-10-21 DIAGNOSIS — C3490 Malignant neoplasm of unspecified part of unspecified bronchus or lung: Secondary | ICD-10-CM | POA: Diagnosis not present

## 2020-10-25 DIAGNOSIS — M542 Cervicalgia: Secondary | ICD-10-CM | POA: Diagnosis not present

## 2020-10-25 DIAGNOSIS — M9931 Osseous stenosis of neural canal of cervical region: Secondary | ICD-10-CM | POA: Diagnosis not present

## 2020-10-25 DIAGNOSIS — M5412 Radiculopathy, cervical region: Secondary | ICD-10-CM | POA: Diagnosis not present

## 2020-10-26 DIAGNOSIS — M542 Cervicalgia: Secondary | ICD-10-CM | POA: Diagnosis not present

## 2020-10-26 DIAGNOSIS — R5383 Other fatigue: Secondary | ICD-10-CM | POA: Diagnosis not present

## 2020-10-26 DIAGNOSIS — C3490 Malignant neoplasm of unspecified part of unspecified bronchus or lung: Secondary | ICD-10-CM | POA: Diagnosis not present

## 2020-10-26 DIAGNOSIS — Z87891 Personal history of nicotine dependence: Secondary | ICD-10-CM | POA: Diagnosis not present

## 2020-10-26 DIAGNOSIS — J449 Chronic obstructive pulmonary disease, unspecified: Secondary | ICD-10-CM | POA: Diagnosis not present

## 2020-10-26 DIAGNOSIS — C3492 Malignant neoplasm of unspecified part of left bronchus or lung: Secondary | ICD-10-CM | POA: Diagnosis not present

## 2020-10-26 DIAGNOSIS — Z7901 Long term (current) use of anticoagulants: Secondary | ICD-10-CM | POA: Diagnosis not present

## 2020-10-26 DIAGNOSIS — E785 Hyperlipidemia, unspecified: Secondary | ICD-10-CM | POA: Diagnosis not present

## 2020-10-26 DIAGNOSIS — Z5112 Encounter for antineoplastic immunotherapy: Secondary | ICD-10-CM | POA: Diagnosis not present

## 2020-10-26 DIAGNOSIS — M79606 Pain in leg, unspecified: Secondary | ICD-10-CM | POA: Diagnosis not present

## 2020-11-09 DIAGNOSIS — C3490 Malignant neoplasm of unspecified part of unspecified bronchus or lung: Secondary | ICD-10-CM | POA: Diagnosis not present

## 2020-11-09 DIAGNOSIS — D649 Anemia, unspecified: Secondary | ICD-10-CM | POA: Diagnosis not present

## 2020-11-09 DIAGNOSIS — E876 Hypokalemia: Secondary | ICD-10-CM | POA: Diagnosis not present

## 2020-11-09 DIAGNOSIS — Z5111 Encounter for antineoplastic chemotherapy: Secondary | ICD-10-CM | POA: Diagnosis not present

## 2020-11-10 ENCOUNTER — Other Ambulatory Visit: Payer: Self-pay | Admitting: Physical Medicine & Rehabilitation

## 2020-11-10 DIAGNOSIS — M5412 Radiculopathy, cervical region: Secondary | ICD-10-CM

## 2020-11-10 DIAGNOSIS — M5114 Intervertebral disc disorders with radiculopathy, thoracic region: Secondary | ICD-10-CM | POA: Diagnosis not present

## 2020-11-10 DIAGNOSIS — M546 Pain in thoracic spine: Secondary | ICD-10-CM | POA: Diagnosis not present

## 2020-11-10 DIAGNOSIS — G8929 Other chronic pain: Secondary | ICD-10-CM | POA: Diagnosis not present

## 2020-11-10 DIAGNOSIS — M542 Cervicalgia: Secondary | ICD-10-CM | POA: Diagnosis not present

## 2020-11-10 DIAGNOSIS — M9931 Osseous stenosis of neural canal of cervical region: Secondary | ICD-10-CM | POA: Diagnosis not present

## 2020-11-18 ENCOUNTER — Ambulatory Visit: Payer: Self-pay | Admitting: *Deleted

## 2020-11-18 NOTE — Telephone Encounter (Signed)
Per agent: "Pt tested postive for covid 11/22/20 wanting advice on what can he take. Mild sx - sore throat with cough, no fever."  Pt reports HOH, wife on call, pt present. Reports tested positive covid today. Symptoms started yesterday. Reports "Mild" cough and sore throat. Cough is nonproductive. Denies fever, sore throat mild. Pt has St. 3  Lung CA. States called PCP "Didn't know anything to do, didn't know about the meds available." Pts wife is positive as well. Home care advise given but advised to call oncologist to alert him of positive status. States will do so after this call. Wife states aware of self isolation guidelines as they were reviewed by her PCP. Wife is on the oral anti-viral med.   Reason for Disposition . 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 COVID-19 DIAGNOSIS: "Who made your COVID-19 diagnosis?" "Was it confirmed by a positive lab test or self-test?" If not diagnosed by a doctor (or NP/PA), ask "Are there lots of cases (community spread) where you live?" Note: See public health department website, if unsure.     Home test, today 2. COVID-19 EXPOSURE: "Was there any known exposure to COVID before the symptoms began?" CDC Definition of close contact: within 6 feet (2 meters) for a total of 15 minutes or more over a 24-hour period.      Wife positive 3. ONSET: "When did the COVID-19 symptoms start?"      Yesterday 4. WORST SYMPTOM: "What is your worst symptom?" (e.g., cough, fever, shortness of breath, muscle aches)     Cough, mild though. Clear 5. COUGH: "Do you have a cough?" If Yes, ask: "How bad is the cough?"      Yes 6. FEVER: "Do you have a fever?" If Yes, ask: "What is your temperature, how was it measured, and when did it start?"     no 7. RESPIRATORY STATUS: "Describe your breathing?" (e.g.,  shortness of breath, wheezing, unable to speak)      *SOB, no more than usual" H/O lung Ca St 3 8. BETTER-SAME-WORSE: "Are you getting better, staying the same or getting worse compared to yesterday?"  If getting worse, ask, "In what way?"     Same 9. HIGH RISK DISEASE: "Do you have any chronic medical problems?" (e.g., asthma, heart or lung disease, weak immune system, obesity, etc.)     *Lung CA 10. VACCINE: "Have you had the COVID-19 vaccine?" If Yes, ask: "Which one, how many shots, when did you get it?"       March 2021 11. BOOSTER: "Have you received your COVID-19 booster?" If Yes, ask: "Which one and when did you get it?"       Dec 2021 1 booster 13. OTHER SYMPTOMS: "Do you have any other symptoms?"  (e.g., chills, fatigue, headache, loss of smell or taste, muscle pain, sore throat)       Sore throat, mild 14. O2 SATURATION MONITOR:  "Do you use an oxygen saturation monitor (pulse oximeter) at home?" If Yes, ask "What is your reading (oxygen level) today?" "What is your usual oxygen saturation reading?" (e.g., 95%)       no  Protocols used: CORONAVIRUS (COVID-19) DIAGNOSED OR SUSPECTED-A-AH

## 2020-11-24 ENCOUNTER — Ambulatory Visit: Payer: Medicare PPO

## 2020-12-02 DIAGNOSIS — M5114 Intervertebral disc disorders with radiculopathy, thoracic region: Secondary | ICD-10-CM | POA: Diagnosis not present

## 2020-12-06 ENCOUNTER — Other Ambulatory Visit: Payer: Self-pay

## 2020-12-06 ENCOUNTER — Ambulatory Visit
Admission: RE | Admit: 2020-12-06 | Discharge: 2020-12-06 | Disposition: A | Discharge status: Home / Self Care | Payer: Medicare PPO | Source: Ambulatory Visit | Attending: Physical Medicine & Rehabilitation | Admitting: Physical Medicine & Rehabilitation

## 2020-12-06 DIAGNOSIS — R200 Anesthesia of skin: Secondary | ICD-10-CM | POA: Diagnosis not present

## 2020-12-06 DIAGNOSIS — M4802 Spinal stenosis, cervical region: Secondary | ICD-10-CM | POA: Diagnosis not present

## 2020-12-06 DIAGNOSIS — G8929 Other chronic pain: Secondary | ICD-10-CM | POA: Diagnosis not present

## 2020-12-06 DIAGNOSIS — M5412 Radiculopathy, cervical region: Secondary | ICD-10-CM | POA: Diagnosis not present

## 2020-12-06 DIAGNOSIS — G9589 Other specified diseases of spinal cord: Secondary | ICD-10-CM | POA: Diagnosis not present

## 2020-12-06 IMAGING — MR MR CERVICAL SPINE WO/W CM
5 of 8 series · 28 of 48 positions shown · IV contrast (gadavist)
Comparison: MRI [DATE]

CLINICAL DATA: Chronic neck pain with bilateral hand numbness.

EXAM:
MRI CERVICAL SPINE WITHOUT AND WITH CONTRAST
TECHNIQUE: Multiplanar and multiecho pulse sequences of the cervical spine, to
include the craniocervical junction and cervicothoracic junction,
were obtained without and with intravenous contrast.
CONTRAST:  9mL GADAVIST GADOBUTROL 1 MMOL/ML IV SOLN

[Series 5: T2 · sagittal · 3.0mm · 0.62mm/px · 4 of 15 slices shown (1 of 2)]
[im 1/15]
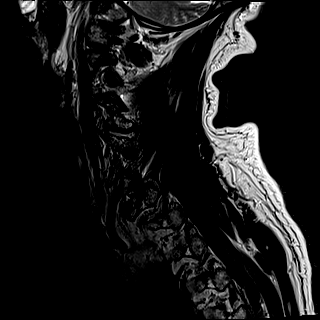
[im 5/15]
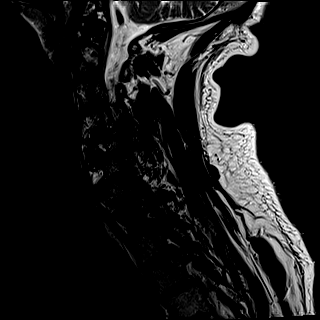
[im 10/15]
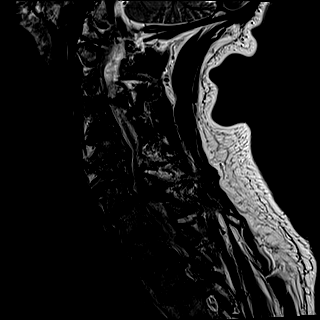
[im 15/15]
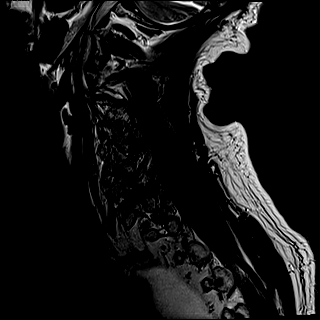

[Series 7: STIR · sagittal · 3.0mm · 0.62mm/px · 4 of 15 slices shown]
[im 1/15]
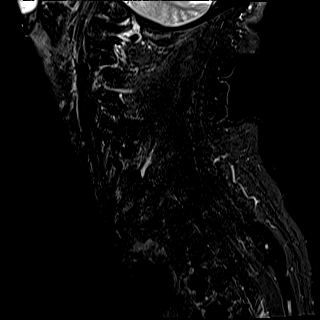
[im 5/15]
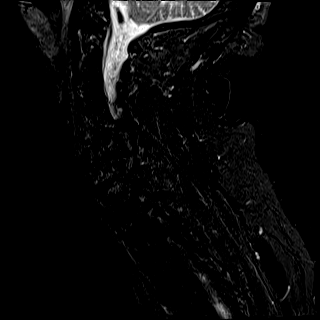
[im 10/15]
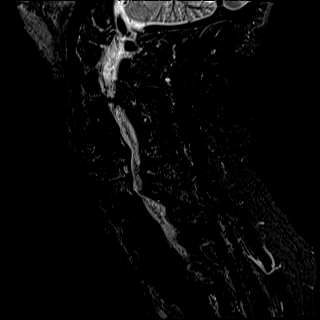
[im 15/15]
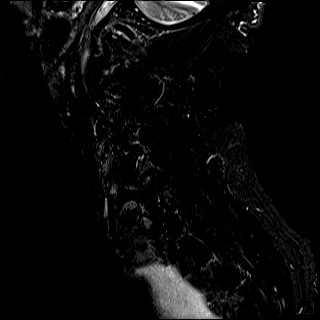

[Series 8: T2 · axial · 3.0mm · 0.70mm/px · z∈[-51,+54]mm · 8 of 32 slices shown (2 of 2)]
[im 1/32]
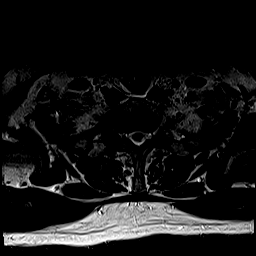
[im 5/32]
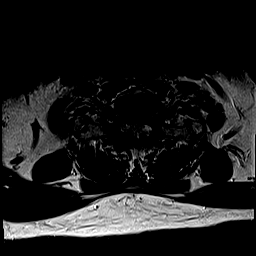
[im 9/32]
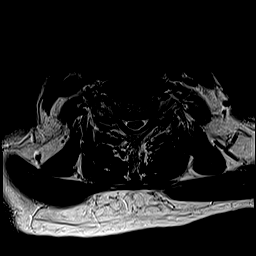
[im 14/32]
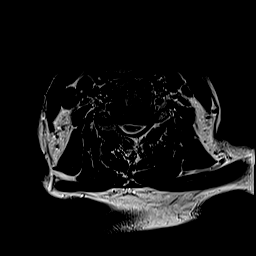
[im 18/32]
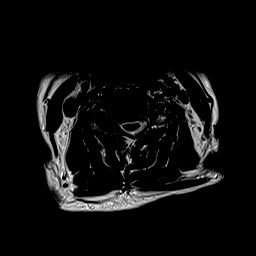
[im 23/32]
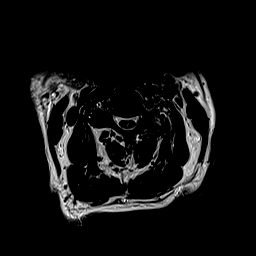
[im 27/32]
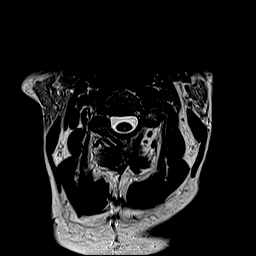
[im 32/32]
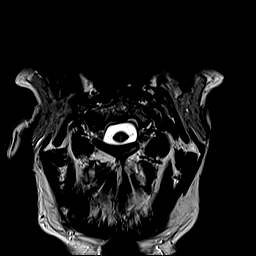

[Series 10: T1 · axial · non-contrast · 3.0mm · 0.35mm/px · z∈[-51,+54]mm · 8 of 32 slices shown]
[im 1/32]
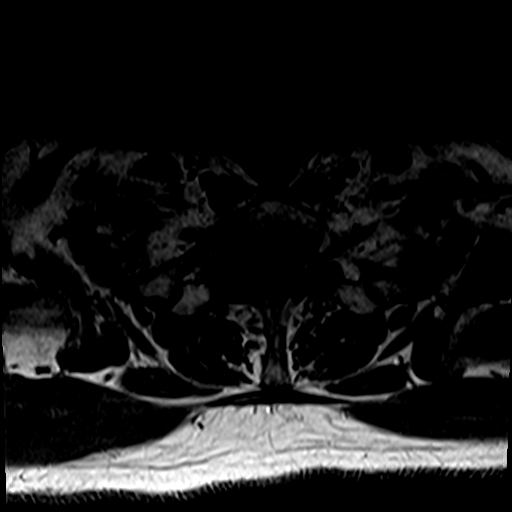
[im 5/32]
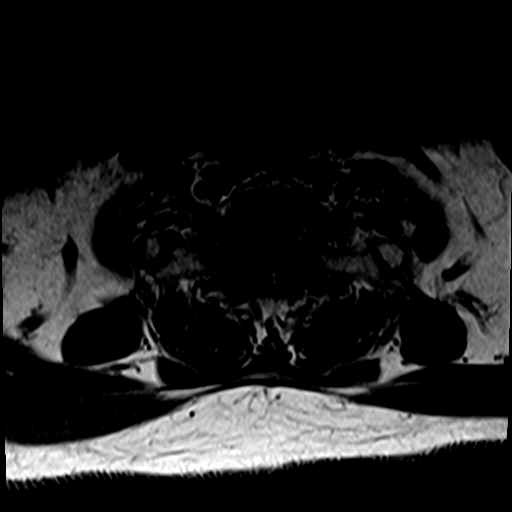
[im 9/32]
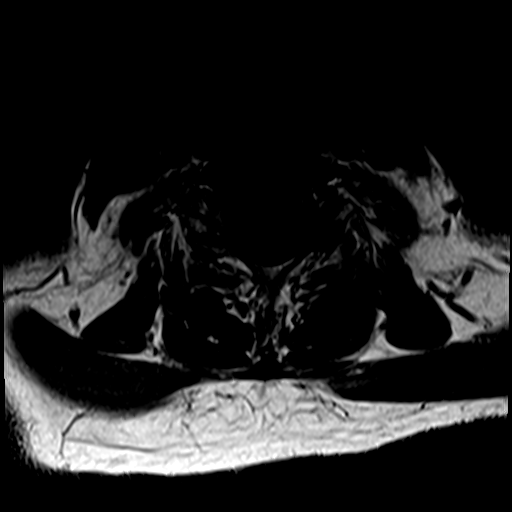
[im 14/32]
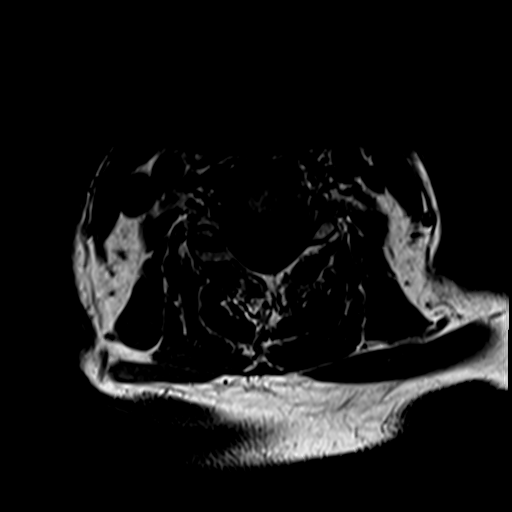
[im 18/32]
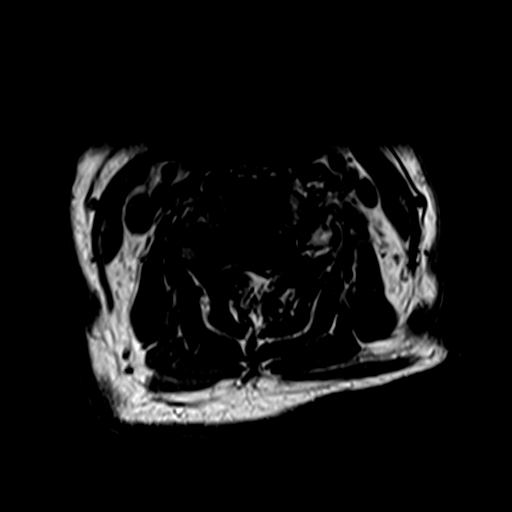
[im 23/32]
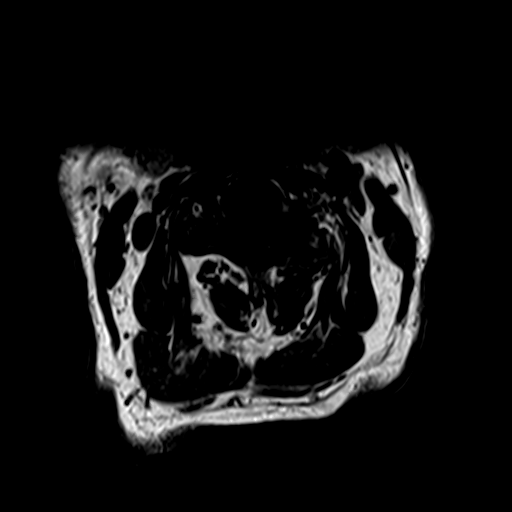
[im 27/32]
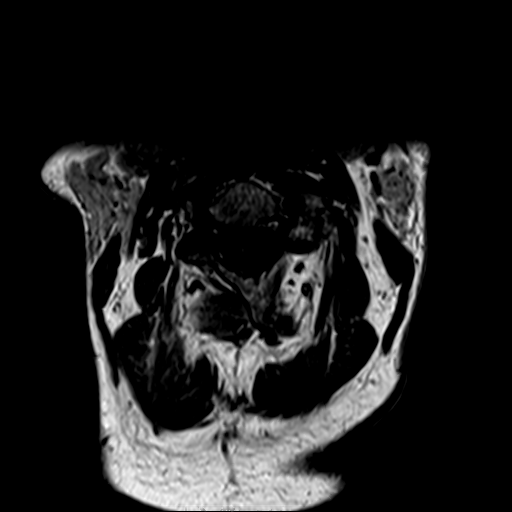
[im 32/32]
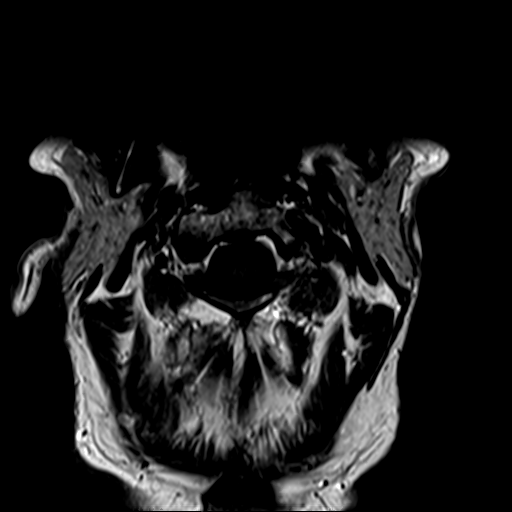

[Series 12: T1 post-contrast · axial · 3.0mm · 0.35mm/px · z∈[-51,-7]mm · 4 of 32 slices shown]
[im 1/32]
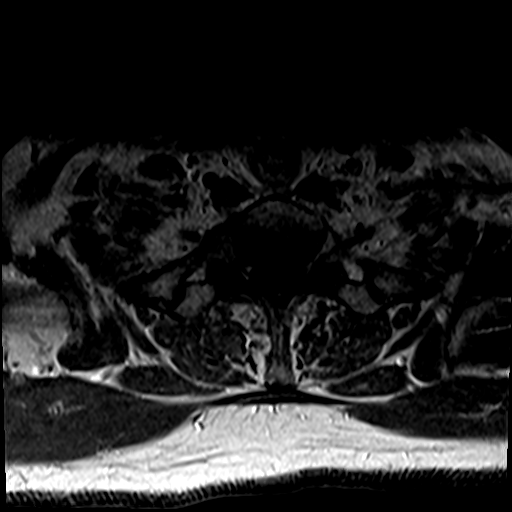
[im 5/32]
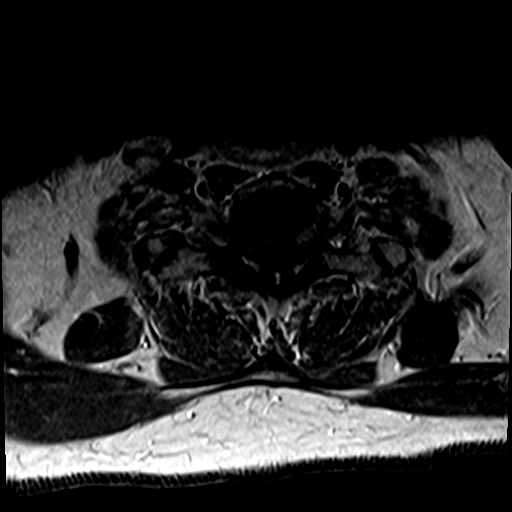
[im 9/32]
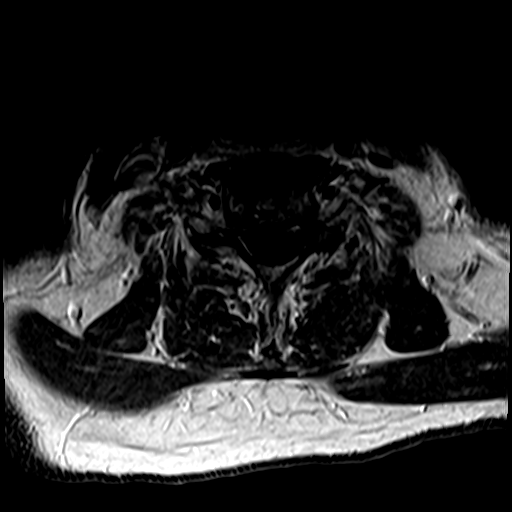
[im 14/32]
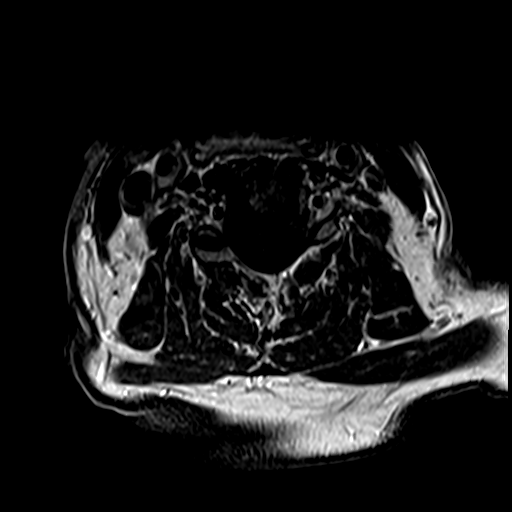

[28 of 48 positions shown; findings below may reference images not displayed]

FINDINGS: Alignment: Stable advanced degenerative cervical spondylosis with
mild multilevel degenerative subluxations. This is most significant
at C6-7 with approximately 3.5 mm of anterior subluxation of C7
compared to T1. The overall alignment is otherwise maintained.

Vertebrae: No bone lesions or fractures. Stable degenerative
endplate reactive changes.

Cord: Stable areas of myelomalacia at C5-6 and C7-T1.

Posterior Fossa, vertebral arteries, paraspinal tissues: No
significant findings.

Disc levels:

C2-3: No significant findings.

C3-4: Significant asymmetric left-sided facet disease contributing
to mild left foraminal stenosis. This appears stable.

C4-5: Advanced degenerative disc disease and severe right-sided
facet disease. There is flattening of the ventral thecal sac and
narrowing the ventral CSF space but no significant spinal stenosis.
Mild to moderate right foraminal stenosis appears stable. Mild left
foraminal stenosis is stable.

C5-6: Severe disc disease with a bulging degenerated annulus, marked
osteophytic ridging and uncinate spurring. Stable moderate spinal
and bilateral foraminal stenosis with areas of myelomalacia in the
cervical cord.

C6-7: Advanced degenerative disc disease and facet disease with a
bulging degenerated annulus, osteophytic ridging and uncinate
spurring. Stable appearing mild spinal stenosis and mild to moderate
bilateral foraminal stenosis.

C7-T1: Degenerative anterior subluxation of C7 with a bulging
uncovered disc, osteophytic ridging and uncinate spurring. There is
also moderate facet disease all contributing to moderate spinal and
bilateral foraminal stenosis which appears relatively stable. Stable
changes of myelomalacia at this level.
IMPRESSION: 1. Stable advanced degenerative cervical spondylosis with multilevel
disc disease and facet disease.
2. Stable appearing moderate spinal and bilateral foraminal stenosis
at C5-6 and C7-T1.
3. Stable changes of myelomalacia at C5-6 and C7-T1.

## 2020-12-06 MED ORDER — GADOBUTROL 1 MMOL/ML IV SOLN
9.0000 mL | Freq: Once | INTRAVENOUS | Status: AC | PRN
  Administered 2020-12-06: 9 mL via INTRAVENOUS

## 2020-12-13 DIAGNOSIS — N4289 Other specified disorders of prostate: Secondary | ICD-10-CM | POA: Diagnosis not present

## 2020-12-13 DIAGNOSIS — Z5111 Encounter for antineoplastic chemotherapy: Secondary | ICD-10-CM | POA: Diagnosis not present

## 2020-12-13 DIAGNOSIS — D61818 Other pancytopenia: Secondary | ICD-10-CM | POA: Diagnosis not present

## 2020-12-13 DIAGNOSIS — Z8616 Personal history of COVID-19: Secondary | ICD-10-CM | POA: Diagnosis not present

## 2020-12-13 DIAGNOSIS — J449 Chronic obstructive pulmonary disease, unspecified: Secondary | ICD-10-CM | POA: Diagnosis not present

## 2020-12-13 DIAGNOSIS — C3490 Malignant neoplasm of unspecified part of unspecified bronchus or lung: Secondary | ICD-10-CM | POA: Diagnosis not present

## 2020-12-13 DIAGNOSIS — E785 Hyperlipidemia, unspecified: Secondary | ICD-10-CM | POA: Diagnosis not present

## 2020-12-13 DIAGNOSIS — E039 Hypothyroidism, unspecified: Secondary | ICD-10-CM | POA: Diagnosis not present

## 2020-12-13 DIAGNOSIS — R0602 Shortness of breath: Secondary | ICD-10-CM | POA: Diagnosis not present

## 2020-12-13 DIAGNOSIS — C3492 Malignant neoplasm of unspecified part of left bronchus or lung: Secondary | ICD-10-CM | POA: Diagnosis not present

## 2020-12-14 ENCOUNTER — Telehealth: Payer: Self-pay | Admitting: Internal Medicine

## 2020-12-22 DIAGNOSIS — M546 Pain in thoracic spine: Secondary | ICD-10-CM | POA: Diagnosis not present

## 2020-12-22 DIAGNOSIS — M5114 Intervertebral disc disorders with radiculopathy, thoracic region: Secondary | ICD-10-CM | POA: Diagnosis not present

## 2020-12-22 DIAGNOSIS — M5412 Radiculopathy, cervical region: Secondary | ICD-10-CM | POA: Diagnosis not present

## 2020-12-22 DIAGNOSIS — M9931 Osseous stenosis of neural canal of cervical region: Secondary | ICD-10-CM | POA: Diagnosis not present

## 2020-12-22 DIAGNOSIS — G8929 Other chronic pain: Secondary | ICD-10-CM | POA: Diagnosis not present

## 2020-12-22 DIAGNOSIS — M542 Cervicalgia: Secondary | ICD-10-CM | POA: Diagnosis not present

## 2020-12-24 DIAGNOSIS — C3490 Malignant neoplasm of unspecified part of unspecified bronchus or lung: Secondary | ICD-10-CM | POA: Diagnosis not present

## 2020-12-24 DIAGNOSIS — J90 Pleural effusion, not elsewhere classified: Secondary | ICD-10-CM | POA: Diagnosis not present

## 2020-12-28 DIAGNOSIS — C3490 Malignant neoplasm of unspecified part of unspecified bronchus or lung: Secondary | ICD-10-CM | POA: Diagnosis not present

## 2020-12-30 DIAGNOSIS — C3490 Malignant neoplasm of unspecified part of unspecified bronchus or lung: Secondary | ICD-10-CM | POA: Diagnosis not present

## 2021-01-03 DIAGNOSIS — Z79890 Hormone replacement therapy (postmenopausal): Secondary | ICD-10-CM | POA: Diagnosis not present

## 2021-01-03 DIAGNOSIS — E785 Hyperlipidemia, unspecified: Secondary | ICD-10-CM | POA: Diagnosis not present

## 2021-01-03 DIAGNOSIS — Z85828 Personal history of other malignant neoplasm of skin: Secondary | ICD-10-CM | POA: Diagnosis not present

## 2021-01-03 DIAGNOSIS — Z87891 Personal history of nicotine dependence: Secondary | ICD-10-CM | POA: Diagnosis not present

## 2021-01-03 DIAGNOSIS — J90 Pleural effusion, not elsewhere classified: Secondary | ICD-10-CM | POA: Diagnosis not present

## 2021-01-03 DIAGNOSIS — Z79899 Other long term (current) drug therapy: Secondary | ICD-10-CM | POA: Diagnosis not present

## 2021-01-03 DIAGNOSIS — Z7901 Long term (current) use of anticoagulants: Secondary | ICD-10-CM | POA: Diagnosis not present

## 2021-01-03 DIAGNOSIS — J449 Chronic obstructive pulmonary disease, unspecified: Secondary | ICD-10-CM | POA: Diagnosis not present

## 2021-01-03 DIAGNOSIS — C3492 Malignant neoplasm of unspecified part of left bronchus or lung: Secondary | ICD-10-CM | POA: Diagnosis not present

## 2021-01-11 DIAGNOSIS — Z87891 Personal history of nicotine dependence: Secondary | ICD-10-CM | POA: Diagnosis not present

## 2021-01-11 DIAGNOSIS — J90 Pleural effusion, not elsewhere classified: Secondary | ICD-10-CM | POA: Diagnosis not present

## 2021-01-11 DIAGNOSIS — E785 Hyperlipidemia, unspecified: Secondary | ICD-10-CM | POA: Diagnosis not present

## 2021-01-11 DIAGNOSIS — D61818 Other pancytopenia: Secondary | ICD-10-CM | POA: Diagnosis not present

## 2021-01-11 DIAGNOSIS — C3492 Malignant neoplasm of unspecified part of left bronchus or lung: Secondary | ICD-10-CM | POA: Diagnosis not present

## 2021-01-11 DIAGNOSIS — C3490 Malignant neoplasm of unspecified part of unspecified bronchus or lung: Secondary | ICD-10-CM | POA: Diagnosis not present

## 2021-01-11 DIAGNOSIS — R110 Nausea: Secondary | ICD-10-CM | POA: Diagnosis not present

## 2021-01-11 DIAGNOSIS — Z801 Family history of malignant neoplasm of trachea, bronchus and lung: Secondary | ICD-10-CM | POA: Diagnosis not present

## 2021-01-11 DIAGNOSIS — J449 Chronic obstructive pulmonary disease, unspecified: Secondary | ICD-10-CM | POA: Diagnosis not present

## 2021-01-11 DIAGNOSIS — Z5112 Encounter for antineoplastic immunotherapy: Secondary | ICD-10-CM | POA: Diagnosis not present

## 2021-01-25 DIAGNOSIS — Z5112 Encounter for antineoplastic immunotherapy: Secondary | ICD-10-CM | POA: Diagnosis not present

## 2021-01-25 DIAGNOSIS — C3490 Malignant neoplasm of unspecified part of unspecified bronchus or lung: Secondary | ICD-10-CM | POA: Diagnosis not present

## 2021-02-07 DIAGNOSIS — H524 Presbyopia: Secondary | ICD-10-CM | POA: Diagnosis not present

## 2021-02-07 DIAGNOSIS — H52223 Regular astigmatism, bilateral: Secondary | ICD-10-CM | POA: Diagnosis not present

## 2021-02-07 DIAGNOSIS — H2513 Age-related nuclear cataract, bilateral: Secondary | ICD-10-CM | POA: Diagnosis not present

## 2021-02-07 DIAGNOSIS — H5203 Hypermetropia, bilateral: Secondary | ICD-10-CM | POA: Diagnosis not present

## 2021-02-08 DIAGNOSIS — J449 Chronic obstructive pulmonary disease, unspecified: Secondary | ICD-10-CM | POA: Diagnosis not present

## 2021-02-08 DIAGNOSIS — Z87891 Personal history of nicotine dependence: Secondary | ICD-10-CM | POA: Diagnosis not present

## 2021-02-08 DIAGNOSIS — C3490 Malignant neoplasm of unspecified part of unspecified bronchus or lung: Secondary | ICD-10-CM | POA: Diagnosis not present

## 2021-02-08 DIAGNOSIS — Z801 Family history of malignant neoplasm of trachea, bronchus and lung: Secondary | ICD-10-CM | POA: Diagnosis not present

## 2021-02-08 DIAGNOSIS — R350 Frequency of micturition: Secondary | ICD-10-CM | POA: Diagnosis not present

## 2021-02-08 DIAGNOSIS — Z79899 Other long term (current) drug therapy: Secondary | ICD-10-CM | POA: Diagnosis not present

## 2021-02-08 DIAGNOSIS — C3492 Malignant neoplasm of unspecified part of left bronchus or lung: Secondary | ICD-10-CM | POA: Diagnosis not present

## 2021-02-08 DIAGNOSIS — Z5112 Encounter for antineoplastic immunotherapy: Secondary | ICD-10-CM | POA: Diagnosis not present

## 2021-02-08 DIAGNOSIS — E785 Hyperlipidemia, unspecified: Secondary | ICD-10-CM | POA: Diagnosis not present

## 2021-02-08 DIAGNOSIS — R001 Bradycardia, unspecified: Secondary | ICD-10-CM | POA: Diagnosis not present

## 2021-02-22 DIAGNOSIS — C3490 Malignant neoplasm of unspecified part of unspecified bronchus or lung: Secondary | ICD-10-CM | POA: Diagnosis not present

## 2021-03-08 DIAGNOSIS — J90 Pleural effusion, not elsewhere classified: Secondary | ICD-10-CM | POA: Diagnosis not present

## 2021-03-08 DIAGNOSIS — Z5112 Encounter for antineoplastic immunotherapy: Secondary | ICD-10-CM | POA: Diagnosis not present

## 2021-03-08 DIAGNOSIS — C3492 Malignant neoplasm of unspecified part of left bronchus or lung: Secondary | ICD-10-CM | POA: Diagnosis not present

## 2021-03-08 DIAGNOSIS — Z87891 Personal history of nicotine dependence: Secondary | ICD-10-CM | POA: Diagnosis not present

## 2021-03-08 DIAGNOSIS — Z79899 Other long term (current) drug therapy: Secondary | ICD-10-CM | POA: Diagnosis not present

## 2021-03-08 DIAGNOSIS — Z801 Family history of malignant neoplasm of trachea, bronchus and lung: Secondary | ICD-10-CM | POA: Diagnosis not present

## 2021-03-08 DIAGNOSIS — E785 Hyperlipidemia, unspecified: Secondary | ICD-10-CM | POA: Diagnosis not present

## 2021-03-08 DIAGNOSIS — J449 Chronic obstructive pulmonary disease, unspecified: Secondary | ICD-10-CM | POA: Diagnosis not present

## 2021-03-08 DIAGNOSIS — C3400 Malignant neoplasm of unspecified main bronchus: Secondary | ICD-10-CM | POA: Diagnosis not present

## 2021-03-08 DIAGNOSIS — Z79890 Hormone replacement therapy (postmenopausal): Secondary | ICD-10-CM | POA: Diagnosis not present

## 2021-03-22 DIAGNOSIS — D649 Anemia, unspecified: Secondary | ICD-10-CM | POA: Diagnosis not present

## 2021-03-22 DIAGNOSIS — Z5112 Encounter for antineoplastic immunotherapy: Secondary | ICD-10-CM | POA: Diagnosis not present

## 2021-03-22 DIAGNOSIS — E876 Hypokalemia: Secondary | ICD-10-CM | POA: Diagnosis not present

## 2021-03-22 DIAGNOSIS — C3490 Malignant neoplasm of unspecified part of unspecified bronchus or lung: Secondary | ICD-10-CM | POA: Diagnosis not present

## 2021-03-23 DIAGNOSIS — Z9181 History of falling: Secondary | ICD-10-CM | POA: Diagnosis not present

## 2021-03-23 DIAGNOSIS — Z139 Encounter for screening, unspecified: Secondary | ICD-10-CM | POA: Diagnosis not present

## 2021-03-23 DIAGNOSIS — Z23 Encounter for immunization: Secondary | ICD-10-CM | POA: Diagnosis not present

## 2021-03-23 DIAGNOSIS — Z79899 Other long term (current) drug therapy: Secondary | ICD-10-CM | POA: Diagnosis not present

## 2021-03-23 DIAGNOSIS — Z1331 Encounter for screening for depression: Secondary | ICD-10-CM | POA: Diagnosis not present

## 2021-03-23 DIAGNOSIS — I10 Essential (primary) hypertension: Secondary | ICD-10-CM | POA: Diagnosis not present

## 2021-03-23 DIAGNOSIS — I2699 Other pulmonary embolism without acute cor pulmonale: Secondary | ICD-10-CM | POA: Diagnosis not present

## 2021-03-23 DIAGNOSIS — C3490 Malignant neoplasm of unspecified part of unspecified bronchus or lung: Secondary | ICD-10-CM | POA: Diagnosis not present

## 2021-03-23 DIAGNOSIS — J439 Emphysema, unspecified: Secondary | ICD-10-CM | POA: Diagnosis not present

## 2021-04-01 DIAGNOSIS — C3490 Malignant neoplasm of unspecified part of unspecified bronchus or lung: Secondary | ICD-10-CM | POA: Diagnosis not present

## 2021-04-01 DIAGNOSIS — J90 Pleural effusion, not elsewhere classified: Secondary | ICD-10-CM | POA: Diagnosis not present

## 2021-04-01 DIAGNOSIS — M799 Soft tissue disorder, unspecified: Secondary | ICD-10-CM | POA: Diagnosis not present

## 2021-04-05 DIAGNOSIS — J90 Pleural effusion, not elsewhere classified: Secondary | ICD-10-CM | POA: Diagnosis not present

## 2021-04-05 DIAGNOSIS — Z792 Long term (current) use of antibiotics: Secondary | ICD-10-CM | POA: Diagnosis not present

## 2021-04-05 DIAGNOSIS — E785 Hyperlipidemia, unspecified: Secondary | ICD-10-CM | POA: Diagnosis not present

## 2021-04-05 DIAGNOSIS — Z79899 Other long term (current) drug therapy: Secondary | ICD-10-CM | POA: Diagnosis not present

## 2021-04-05 DIAGNOSIS — C3490 Malignant neoplasm of unspecified part of unspecified bronchus or lung: Secondary | ICD-10-CM | POA: Diagnosis not present

## 2021-04-05 DIAGNOSIS — Z87891 Personal history of nicotine dependence: Secondary | ICD-10-CM | POA: Diagnosis not present

## 2021-04-05 DIAGNOSIS — J449 Chronic obstructive pulmonary disease, unspecified: Secondary | ICD-10-CM | POA: Diagnosis not present

## 2021-04-05 DIAGNOSIS — J189 Pneumonia, unspecified organism: Secondary | ICD-10-CM | POA: Diagnosis not present

## 2021-04-05 DIAGNOSIS — C3492 Malignant neoplasm of unspecified part of left bronchus or lung: Secondary | ICD-10-CM | POA: Diagnosis not present

## 2021-04-05 DIAGNOSIS — R972 Elevated prostate specific antigen [PSA]: Secondary | ICD-10-CM | POA: Diagnosis not present

## 2021-04-05 DIAGNOSIS — Z5112 Encounter for antineoplastic immunotherapy: Secondary | ICD-10-CM | POA: Diagnosis not present

## 2021-04-19 DIAGNOSIS — J189 Pneumonia, unspecified organism: Secondary | ICD-10-CM | POA: Diagnosis not present

## 2021-04-19 DIAGNOSIS — D61818 Other pancytopenia: Secondary | ICD-10-CM | POA: Diagnosis not present

## 2021-04-19 DIAGNOSIS — Z87891 Personal history of nicotine dependence: Secondary | ICD-10-CM | POA: Diagnosis not present

## 2021-04-19 DIAGNOSIS — Z79890 Hormone replacement therapy (postmenopausal): Secondary | ICD-10-CM | POA: Diagnosis not present

## 2021-04-19 DIAGNOSIS — E039 Hypothyroidism, unspecified: Secondary | ICD-10-CM | POA: Diagnosis not present

## 2021-04-19 DIAGNOSIS — C3490 Malignant neoplasm of unspecified part of unspecified bronchus or lung: Secondary | ICD-10-CM | POA: Diagnosis not present

## 2021-05-03 DIAGNOSIS — J449 Chronic obstructive pulmonary disease, unspecified: Secondary | ICD-10-CM | POA: Diagnosis not present

## 2021-05-03 DIAGNOSIS — Z801 Family history of malignant neoplasm of trachea, bronchus and lung: Secondary | ICD-10-CM | POA: Diagnosis not present

## 2021-05-03 DIAGNOSIS — J90 Pleural effusion, not elsewhere classified: Secondary | ICD-10-CM | POA: Diagnosis not present

## 2021-05-03 DIAGNOSIS — Z87891 Personal history of nicotine dependence: Secondary | ICD-10-CM | POA: Diagnosis not present

## 2021-05-03 DIAGNOSIS — M5414 Radiculopathy, thoracic region: Secondary | ICD-10-CM | POA: Diagnosis not present

## 2021-05-03 DIAGNOSIS — C3492 Malignant neoplasm of unspecified part of left bronchus or lung: Secondary | ICD-10-CM | POA: Diagnosis not present

## 2021-05-03 DIAGNOSIS — E785 Hyperlipidemia, unspecified: Secondary | ICD-10-CM | POA: Diagnosis not present

## 2021-05-03 DIAGNOSIS — Z7901 Long term (current) use of anticoagulants: Secondary | ICD-10-CM | POA: Diagnosis not present

## 2021-05-03 DIAGNOSIS — C3490 Malignant neoplasm of unspecified part of unspecified bronchus or lung: Secondary | ICD-10-CM | POA: Diagnosis not present

## 2021-05-03 DIAGNOSIS — D61818 Other pancytopenia: Secondary | ICD-10-CM | POA: Diagnosis not present

## 2021-05-03 DIAGNOSIS — Z86711 Personal history of pulmonary embolism: Secondary | ICD-10-CM | POA: Diagnosis not present

## 2021-05-05 DIAGNOSIS — E785 Hyperlipidemia, unspecified: Secondary | ICD-10-CM | POA: Diagnosis not present

## 2021-05-05 DIAGNOSIS — J449 Chronic obstructive pulmonary disease, unspecified: Secondary | ICD-10-CM | POA: Diagnosis not present

## 2021-05-05 DIAGNOSIS — Z86711 Personal history of pulmonary embolism: Secondary | ICD-10-CM | POA: Diagnosis not present

## 2021-05-05 DIAGNOSIS — Z923 Personal history of irradiation: Secondary | ICD-10-CM | POA: Diagnosis not present

## 2021-05-05 DIAGNOSIS — Z79899 Other long term (current) drug therapy: Secondary | ICD-10-CM | POA: Diagnosis not present

## 2021-05-05 DIAGNOSIS — Z9221 Personal history of antineoplastic chemotherapy: Secondary | ICD-10-CM | POA: Diagnosis not present

## 2021-05-05 DIAGNOSIS — Z85118 Personal history of other malignant neoplasm of bronchus and lung: Secondary | ICD-10-CM | POA: Diagnosis not present

## 2021-05-05 DIAGNOSIS — J90 Pleural effusion, not elsewhere classified: Secondary | ICD-10-CM | POA: Diagnosis not present

## 2021-05-05 DIAGNOSIS — Z7901 Long term (current) use of anticoagulants: Secondary | ICD-10-CM | POA: Diagnosis not present

## 2021-05-17 DIAGNOSIS — Z79899 Other long term (current) drug therapy: Secondary | ICD-10-CM | POA: Diagnosis not present

## 2021-05-17 DIAGNOSIS — Z87891 Personal history of nicotine dependence: Secondary | ICD-10-CM | POA: Diagnosis not present

## 2021-05-17 DIAGNOSIS — D72810 Lymphocytopenia: Secondary | ICD-10-CM | POA: Diagnosis not present

## 2021-05-17 DIAGNOSIS — E785 Hyperlipidemia, unspecified: Secondary | ICD-10-CM | POA: Diagnosis not present

## 2021-05-17 DIAGNOSIS — J90 Pleural effusion, not elsewhere classified: Secondary | ICD-10-CM | POA: Diagnosis not present

## 2021-05-17 DIAGNOSIS — Z5112 Encounter for antineoplastic immunotherapy: Secondary | ICD-10-CM | POA: Diagnosis not present

## 2021-05-17 DIAGNOSIS — J449 Chronic obstructive pulmonary disease, unspecified: Secondary | ICD-10-CM | POA: Diagnosis not present

## 2021-05-17 DIAGNOSIS — C3490 Malignant neoplasm of unspecified part of unspecified bronchus or lung: Secondary | ICD-10-CM | POA: Diagnosis not present

## 2021-05-17 DIAGNOSIS — R972 Elevated prostate specific antigen [PSA]: Secondary | ICD-10-CM | POA: Diagnosis not present

## 2021-05-17 DIAGNOSIS — D61818 Other pancytopenia: Secondary | ICD-10-CM | POA: Diagnosis not present

## 2021-05-31 DIAGNOSIS — Z5112 Encounter for antineoplastic immunotherapy: Secondary | ICD-10-CM | POA: Diagnosis not present

## 2021-05-31 DIAGNOSIS — D72810 Lymphocytopenia: Secondary | ICD-10-CM | POA: Diagnosis not present

## 2021-05-31 DIAGNOSIS — Z87891 Personal history of nicotine dependence: Secondary | ICD-10-CM | POA: Diagnosis not present

## 2021-05-31 DIAGNOSIS — D649 Anemia, unspecified: Secondary | ICD-10-CM | POA: Diagnosis not present

## 2021-05-31 DIAGNOSIS — C3490 Malignant neoplasm of unspecified part of unspecified bronchus or lung: Secondary | ICD-10-CM | POA: Diagnosis not present

## 2021-05-31 DIAGNOSIS — J90 Pleural effusion, not elsewhere classified: Secondary | ICD-10-CM | POA: Diagnosis not present

## 2021-05-31 DIAGNOSIS — Z801 Family history of malignant neoplasm of trachea, bronchus and lung: Secondary | ICD-10-CM | POA: Diagnosis not present

## 2021-05-31 DIAGNOSIS — J449 Chronic obstructive pulmonary disease, unspecified: Secondary | ICD-10-CM | POA: Diagnosis not present

## 2021-05-31 DIAGNOSIS — E876 Hypokalemia: Secondary | ICD-10-CM | POA: Diagnosis not present

## 2021-05-31 DIAGNOSIS — M5414 Radiculopathy, thoracic region: Secondary | ICD-10-CM | POA: Diagnosis not present

## 2021-06-14 DIAGNOSIS — C3490 Malignant neoplasm of unspecified part of unspecified bronchus or lung: Secondary | ICD-10-CM | POA: Diagnosis not present

## 2021-06-14 DIAGNOSIS — E876 Hypokalemia: Secondary | ICD-10-CM | POA: Diagnosis not present

## 2021-06-14 DIAGNOSIS — D649 Anemia, unspecified: Secondary | ICD-10-CM | POA: Diagnosis not present

## 2021-06-24 DIAGNOSIS — J90 Pleural effusion, not elsewhere classified: Secondary | ICD-10-CM | POA: Diagnosis not present

## 2021-06-24 DIAGNOSIS — Z9889 Other specified postprocedural states: Secondary | ICD-10-CM | POA: Diagnosis not present

## 2021-06-24 DIAGNOSIS — C3490 Malignant neoplasm of unspecified part of unspecified bronchus or lung: Secondary | ICD-10-CM | POA: Diagnosis not present

## 2021-06-28 DIAGNOSIS — M5414 Radiculopathy, thoracic region: Secondary | ICD-10-CM | POA: Diagnosis not present

## 2021-06-28 DIAGNOSIS — E785 Hyperlipidemia, unspecified: Secondary | ICD-10-CM | POA: Diagnosis not present

## 2021-06-28 DIAGNOSIS — J449 Chronic obstructive pulmonary disease, unspecified: Secondary | ICD-10-CM | POA: Diagnosis not present

## 2021-06-28 DIAGNOSIS — J90 Pleural effusion, not elsewhere classified: Secondary | ICD-10-CM | POA: Diagnosis not present

## 2021-06-28 DIAGNOSIS — C3490 Malignant neoplasm of unspecified part of unspecified bronchus or lung: Secondary | ICD-10-CM | POA: Diagnosis not present

## 2021-06-28 DIAGNOSIS — Z5112 Encounter for antineoplastic immunotherapy: Secondary | ICD-10-CM | POA: Diagnosis not present

## 2021-06-28 DIAGNOSIS — Z79899 Other long term (current) drug therapy: Secondary | ICD-10-CM | POA: Diagnosis not present

## 2021-06-28 DIAGNOSIS — Z87891 Personal history of nicotine dependence: Secondary | ICD-10-CM | POA: Diagnosis not present

## 2021-07-07 DIAGNOSIS — N400 Enlarged prostate without lower urinary tract symptoms: Secondary | ICD-10-CM | POA: Diagnosis not present

## 2021-07-12 DIAGNOSIS — Z5112 Encounter for antineoplastic immunotherapy: Secondary | ICD-10-CM | POA: Diagnosis not present

## 2021-07-12 DIAGNOSIS — C3490 Malignant neoplasm of unspecified part of unspecified bronchus or lung: Secondary | ICD-10-CM | POA: Diagnosis not present

## 2021-07-18 DIAGNOSIS — Z85828 Personal history of other malignant neoplasm of skin: Secondary | ICD-10-CM | POA: Diagnosis not present

## 2021-07-18 DIAGNOSIS — Z87891 Personal history of nicotine dependence: Secondary | ICD-10-CM | POA: Diagnosis not present

## 2021-07-18 DIAGNOSIS — J449 Chronic obstructive pulmonary disease, unspecified: Secondary | ICD-10-CM | POA: Diagnosis not present

## 2021-07-18 DIAGNOSIS — J90 Pleural effusion, not elsewhere classified: Secondary | ICD-10-CM | POA: Diagnosis not present

## 2021-07-18 DIAGNOSIS — Z79899 Other long term (current) drug therapy: Secondary | ICD-10-CM | POA: Diagnosis not present

## 2021-07-18 DIAGNOSIS — Z801 Family history of malignant neoplasm of trachea, bronchus and lung: Secondary | ICD-10-CM | POA: Diagnosis not present

## 2021-07-18 DIAGNOSIS — E785 Hyperlipidemia, unspecified: Secondary | ICD-10-CM | POA: Diagnosis not present

## 2021-07-22 DIAGNOSIS — R918 Other nonspecific abnormal finding of lung field: Secondary | ICD-10-CM | POA: Diagnosis not present

## 2021-07-26 DIAGNOSIS — M5414 Radiculopathy, thoracic region: Secondary | ICD-10-CM | POA: Diagnosis not present

## 2021-07-26 DIAGNOSIS — E785 Hyperlipidemia, unspecified: Secondary | ICD-10-CM | POA: Diagnosis not present

## 2021-07-26 DIAGNOSIS — Z87891 Personal history of nicotine dependence: Secondary | ICD-10-CM | POA: Diagnosis not present

## 2021-07-26 DIAGNOSIS — J449 Chronic obstructive pulmonary disease, unspecified: Secondary | ICD-10-CM | POA: Diagnosis not present

## 2021-07-26 DIAGNOSIS — Z79890 Hormone replacement therapy (postmenopausal): Secondary | ICD-10-CM | POA: Diagnosis not present

## 2021-07-26 DIAGNOSIS — C3492 Malignant neoplasm of unspecified part of left bronchus or lung: Secondary | ICD-10-CM | POA: Diagnosis not present

## 2021-07-26 DIAGNOSIS — C3490 Malignant neoplasm of unspecified part of unspecified bronchus or lung: Secondary | ICD-10-CM | POA: Diagnosis not present

## 2021-07-26 DIAGNOSIS — Z85828 Personal history of other malignant neoplasm of skin: Secondary | ICD-10-CM | POA: Diagnosis not present

## 2021-07-26 DIAGNOSIS — Z79899 Other long term (current) drug therapy: Secondary | ICD-10-CM | POA: Diagnosis not present

## 2021-07-26 DIAGNOSIS — D61818 Other pancytopenia: Secondary | ICD-10-CM | POA: Diagnosis not present

## 2021-07-26 DIAGNOSIS — Z5112 Encounter for antineoplastic immunotherapy: Secondary | ICD-10-CM | POA: Diagnosis not present

## 2021-08-05 DIAGNOSIS — C61 Malignant neoplasm of prostate: Secondary | ICD-10-CM | POA: Diagnosis not present

## 2021-08-05 DIAGNOSIS — D649 Anemia, unspecified: Secondary | ICD-10-CM | POA: Diagnosis not present

## 2021-08-05 DIAGNOSIS — N400 Enlarged prostate without lower urinary tract symptoms: Secondary | ICD-10-CM | POA: Diagnosis not present

## 2021-08-05 DIAGNOSIS — E876 Hypokalemia: Secondary | ICD-10-CM | POA: Diagnosis not present

## 2021-08-05 DIAGNOSIS — C3490 Malignant neoplasm of unspecified part of unspecified bronchus or lung: Secondary | ICD-10-CM | POA: Diagnosis not present

## 2021-08-09 DIAGNOSIS — Z5112 Encounter for antineoplastic immunotherapy: Secondary | ICD-10-CM | POA: Diagnosis not present

## 2021-08-09 DIAGNOSIS — C3490 Malignant neoplasm of unspecified part of unspecified bronchus or lung: Secondary | ICD-10-CM | POA: Diagnosis not present

## 2021-09-12 ENCOUNTER — Ambulatory Visit: Payer: Self-pay | Admitting: *Deleted

## 2021-09-12 NOTE — Telephone Encounter (Signed)
Jason Leon is not a pt in this practice, spoke with Jason Leon, pt is calling different practices to see if someone will flush his port so he does not have to go to [facility] for a flush. Explained that the practices will not just see a non established pt. Encouraged to call [facility] since has upcoming appt and see if it is okay to wait until then. ?

## 2021-09-22 DIAGNOSIS — M5412 Radiculopathy, cervical region: Secondary | ICD-10-CM | POA: Diagnosis not present

## 2021-09-22 DIAGNOSIS — Z86711 Personal history of pulmonary embolism: Secondary | ICD-10-CM | POA: Diagnosis not present

## 2021-09-22 DIAGNOSIS — I10 Essential (primary) hypertension: Secondary | ICD-10-CM | POA: Diagnosis not present

## 2021-09-22 DIAGNOSIS — J439 Emphysema, unspecified: Secondary | ICD-10-CM | POA: Diagnosis not present

## 2021-09-22 DIAGNOSIS — E7800 Pure hypercholesterolemia, unspecified: Secondary | ICD-10-CM | POA: Diagnosis not present

## 2021-09-22 DIAGNOSIS — M5030 Other cervical disc degeneration, unspecified cervical region: Secondary | ICD-10-CM | POA: Diagnosis not present

## 2021-09-22 DIAGNOSIS — Z79899 Other long term (current) drug therapy: Secondary | ICD-10-CM | POA: Diagnosis not present

## 2021-09-22 DIAGNOSIS — R7303 Prediabetes: Secondary | ICD-10-CM | POA: Diagnosis not present

## 2021-09-22 DIAGNOSIS — C3490 Malignant neoplasm of unspecified part of unspecified bronchus or lung: Secondary | ICD-10-CM | POA: Diagnosis not present

## 2021-09-23 DIAGNOSIS — C61 Malignant neoplasm of prostate: Secondary | ICD-10-CM | POA: Diagnosis not present

## 2021-09-23 DIAGNOSIS — R972 Elevated prostate specific antigen [PSA]: Secondary | ICD-10-CM | POA: Diagnosis not present

## 2021-09-27 DIAGNOSIS — J449 Chronic obstructive pulmonary disease, unspecified: Secondary | ICD-10-CM | POA: Diagnosis not present

## 2021-09-27 DIAGNOSIS — G8929 Other chronic pain: Secondary | ICD-10-CM | POA: Diagnosis not present

## 2021-09-27 DIAGNOSIS — Z87891 Personal history of nicotine dependence: Secondary | ICD-10-CM | POA: Diagnosis not present

## 2021-09-27 DIAGNOSIS — Z79899 Other long term (current) drug therapy: Secondary | ICD-10-CM | POA: Diagnosis not present

## 2021-09-27 DIAGNOSIS — E785 Hyperlipidemia, unspecified: Secondary | ICD-10-CM | POA: Diagnosis not present

## 2021-09-27 DIAGNOSIS — Z48810 Encounter for surgical aftercare following surgery on the sense organs: Secondary | ICD-10-CM | POA: Diagnosis not present

## 2021-09-27 DIAGNOSIS — C3490 Malignant neoplasm of unspecified part of unspecified bronchus or lung: Secondary | ICD-10-CM | POA: Diagnosis not present

## 2021-09-27 DIAGNOSIS — R609 Edema, unspecified: Secondary | ICD-10-CM | POA: Diagnosis not present

## 2021-09-27 DIAGNOSIS — R079 Chest pain, unspecified: Secondary | ICD-10-CM | POA: Diagnosis not present

## 2021-09-27 DIAGNOSIS — M5414 Radiculopathy, thoracic region: Secondary | ICD-10-CM | POA: Diagnosis not present

## 2021-10-03 DIAGNOSIS — I2699 Other pulmonary embolism without acute cor pulmonale: Secondary | ICD-10-CM | POA: Diagnosis not present

## 2021-10-03 DIAGNOSIS — M5414 Radiculopathy, thoracic region: Secondary | ICD-10-CM | POA: Diagnosis not present

## 2021-10-03 DIAGNOSIS — R609 Edema, unspecified: Secondary | ICD-10-CM | POA: Diagnosis not present

## 2021-10-03 DIAGNOSIS — C61 Malignant neoplasm of prostate: Secondary | ICD-10-CM | POA: Diagnosis not present

## 2021-10-05 DIAGNOSIS — C61 Malignant neoplasm of prostate: Secondary | ICD-10-CM | POA: Diagnosis not present

## 2021-10-11 DIAGNOSIS — R600 Localized edema: Secondary | ICD-10-CM | POA: Diagnosis not present

## 2021-10-11 DIAGNOSIS — R079 Chest pain, unspecified: Secondary | ICD-10-CM | POA: Diagnosis not present

## 2021-10-11 DIAGNOSIS — C3490 Malignant neoplasm of unspecified part of unspecified bronchus or lung: Secondary | ICD-10-CM | POA: Diagnosis not present

## 2021-10-11 DIAGNOSIS — M5414 Radiculopathy, thoracic region: Secondary | ICD-10-CM | POA: Diagnosis not present

## 2021-10-12 DIAGNOSIS — C61 Malignant neoplasm of prostate: Secondary | ICD-10-CM | POA: Diagnosis not present

## 2021-10-12 DIAGNOSIS — D649 Anemia, unspecified: Secondary | ICD-10-CM | POA: Diagnosis not present

## 2021-10-12 DIAGNOSIS — C3490 Malignant neoplasm of unspecified part of unspecified bronchus or lung: Secondary | ICD-10-CM | POA: Diagnosis not present

## 2021-10-12 DIAGNOSIS — E876 Hypokalemia: Secondary | ICD-10-CM | POA: Diagnosis not present

## 2021-10-17 DIAGNOSIS — C61 Malignant neoplasm of prostate: Secondary | ICD-10-CM | POA: Diagnosis not present

## 2021-10-21 DIAGNOSIS — Z79899 Other long term (current) drug therapy: Secondary | ICD-10-CM | POA: Diagnosis not present

## 2021-10-21 DIAGNOSIS — Z7969 Long term (current) use of other immunomodulators and immunosuppressants: Secondary | ICD-10-CM | POA: Diagnosis not present

## 2021-10-21 DIAGNOSIS — C61 Malignant neoplasm of prostate: Secondary | ICD-10-CM | POA: Diagnosis not present

## 2021-10-21 DIAGNOSIS — Z923 Personal history of irradiation: Secondary | ICD-10-CM | POA: Diagnosis not present

## 2021-10-21 DIAGNOSIS — R918 Other nonspecific abnormal finding of lung field: Secondary | ICD-10-CM | POA: Diagnosis not present

## 2021-10-21 DIAGNOSIS — Z9221 Personal history of antineoplastic chemotherapy: Secondary | ICD-10-CM | POA: Diagnosis not present

## 2021-10-21 DIAGNOSIS — M85852 Other specified disorders of bone density and structure, left thigh: Secondary | ICD-10-CM | POA: Diagnosis not present

## 2021-10-21 DIAGNOSIS — C3490 Malignant neoplasm of unspecified part of unspecified bronchus or lung: Secondary | ICD-10-CM | POA: Diagnosis not present

## 2021-10-21 DIAGNOSIS — J90 Pleural effusion, not elsewhere classified: Secondary | ICD-10-CM | POA: Diagnosis not present

## 2021-10-21 DIAGNOSIS — I3139 Other pericardial effusion (noninflammatory): Secondary | ICD-10-CM | POA: Diagnosis not present

## 2021-10-25 DIAGNOSIS — G8929 Other chronic pain: Secondary | ICD-10-CM | POA: Diagnosis not present

## 2021-10-25 DIAGNOSIS — Z801 Family history of malignant neoplasm of trachea, bronchus and lung: Secondary | ICD-10-CM | POA: Diagnosis not present

## 2021-10-25 DIAGNOSIS — C3490 Malignant neoplasm of unspecified part of unspecified bronchus or lung: Secondary | ICD-10-CM | POA: Diagnosis not present

## 2021-10-25 DIAGNOSIS — Z87891 Personal history of nicotine dependence: Secondary | ICD-10-CM | POA: Diagnosis not present

## 2021-10-25 DIAGNOSIS — Z48810 Encounter for surgical aftercare following surgery on the sense organs: Secondary | ICD-10-CM | POA: Diagnosis not present

## 2021-10-25 DIAGNOSIS — Z7901 Long term (current) use of anticoagulants: Secondary | ICD-10-CM | POA: Diagnosis not present

## 2021-10-25 DIAGNOSIS — M5414 Radiculopathy, thoracic region: Secondary | ICD-10-CM | POA: Diagnosis not present

## 2021-10-25 DIAGNOSIS — J449 Chronic obstructive pulmonary disease, unspecified: Secondary | ICD-10-CM | POA: Diagnosis not present

## 2021-10-25 DIAGNOSIS — C61 Malignant neoplasm of prostate: Secondary | ICD-10-CM | POA: Diagnosis not present

## 2021-10-25 DIAGNOSIS — E785 Hyperlipidemia, unspecified: Secondary | ICD-10-CM | POA: Diagnosis not present

## 2021-11-04 DIAGNOSIS — R609 Edema, unspecified: Secondary | ICD-10-CM | POA: Diagnosis not present

## 2021-11-04 DIAGNOSIS — I2699 Other pulmonary embolism without acute cor pulmonale: Secondary | ICD-10-CM | POA: Diagnosis not present

## 2021-11-04 DIAGNOSIS — C61 Malignant neoplasm of prostate: Secondary | ICD-10-CM | POA: Diagnosis not present

## 2021-11-07 DIAGNOSIS — C61 Malignant neoplasm of prostate: Secondary | ICD-10-CM | POA: Diagnosis not present

## 2021-11-09 DIAGNOSIS — Z5181 Encounter for therapeutic drug level monitoring: Secondary | ICD-10-CM | POA: Diagnosis not present

## 2021-11-09 DIAGNOSIS — C61 Malignant neoplasm of prostate: Secondary | ICD-10-CM | POA: Diagnosis not present

## 2021-11-09 DIAGNOSIS — Z79818 Long term (current) use of other agents affecting estrogen receptors and estrogen levels: Secondary | ICD-10-CM | POA: Diagnosis not present

## 2021-11-16 DIAGNOSIS — C61 Malignant neoplasm of prostate: Secondary | ICD-10-CM | POA: Diagnosis not present

## 2021-11-16 DIAGNOSIS — Z79818 Long term (current) use of other agents affecting estrogen receptors and estrogen levels: Secondary | ICD-10-CM | POA: Diagnosis not present

## 2021-11-16 DIAGNOSIS — Z5181 Encounter for therapeutic drug level monitoring: Secondary | ICD-10-CM | POA: Diagnosis not present

## 2021-11-30 DIAGNOSIS — C61 Malignant neoplasm of prostate: Secondary | ICD-10-CM | POA: Diagnosis not present

## 2021-11-30 DIAGNOSIS — Z5181 Encounter for therapeutic drug level monitoring: Secondary | ICD-10-CM | POA: Diagnosis not present

## 2021-11-30 DIAGNOSIS — Z79818 Long term (current) use of other agents affecting estrogen receptors and estrogen levels: Secondary | ICD-10-CM | POA: Diagnosis not present

## 2021-12-01 DIAGNOSIS — Z5181 Encounter for therapeutic drug level monitoring: Secondary | ICD-10-CM | POA: Diagnosis not present

## 2021-12-01 DIAGNOSIS — C61 Malignant neoplasm of prostate: Secondary | ICD-10-CM | POA: Diagnosis not present

## 2021-12-01 DIAGNOSIS — Z79818 Long term (current) use of other agents affecting estrogen receptors and estrogen levels: Secondary | ICD-10-CM | POA: Diagnosis not present

## 2021-12-02 DIAGNOSIS — Z5181 Encounter for therapeutic drug level monitoring: Secondary | ICD-10-CM | POA: Diagnosis not present

## 2021-12-02 DIAGNOSIS — C61 Malignant neoplasm of prostate: Secondary | ICD-10-CM | POA: Diagnosis not present

## 2021-12-02 DIAGNOSIS — Z79818 Long term (current) use of other agents affecting estrogen receptors and estrogen levels: Secondary | ICD-10-CM | POA: Diagnosis not present

## 2021-12-05 DIAGNOSIS — Z5181 Encounter for therapeutic drug level monitoring: Secondary | ICD-10-CM | POA: Diagnosis not present

## 2021-12-05 DIAGNOSIS — C61 Malignant neoplasm of prostate: Secondary | ICD-10-CM | POA: Diagnosis not present

## 2021-12-05 DIAGNOSIS — Z79818 Long term (current) use of other agents affecting estrogen receptors and estrogen levels: Secondary | ICD-10-CM | POA: Diagnosis not present

## 2021-12-06 DIAGNOSIS — Z79818 Long term (current) use of other agents affecting estrogen receptors and estrogen levels: Secondary | ICD-10-CM | POA: Diagnosis not present

## 2021-12-06 DIAGNOSIS — Z5181 Encounter for therapeutic drug level monitoring: Secondary | ICD-10-CM | POA: Diagnosis not present

## 2021-12-06 DIAGNOSIS — C61 Malignant neoplasm of prostate: Secondary | ICD-10-CM | POA: Diagnosis not present

## 2021-12-07 DIAGNOSIS — Z79818 Long term (current) use of other agents affecting estrogen receptors and estrogen levels: Secondary | ICD-10-CM | POA: Diagnosis not present

## 2021-12-07 DIAGNOSIS — Z5181 Encounter for therapeutic drug level monitoring: Secondary | ICD-10-CM | POA: Diagnosis not present

## 2021-12-07 DIAGNOSIS — C61 Malignant neoplasm of prostate: Secondary | ICD-10-CM | POA: Diagnosis not present

## 2021-12-08 DIAGNOSIS — C61 Malignant neoplasm of prostate: Secondary | ICD-10-CM | POA: Diagnosis not present

## 2021-12-08 DIAGNOSIS — Z79818 Long term (current) use of other agents affecting estrogen receptors and estrogen levels: Secondary | ICD-10-CM | POA: Diagnosis not present

## 2021-12-08 DIAGNOSIS — Z5181 Encounter for therapeutic drug level monitoring: Secondary | ICD-10-CM | POA: Diagnosis not present

## 2021-12-09 DIAGNOSIS — Z79818 Long term (current) use of other agents affecting estrogen receptors and estrogen levels: Secondary | ICD-10-CM | POA: Diagnosis not present

## 2021-12-09 DIAGNOSIS — Z5181 Encounter for therapeutic drug level monitoring: Secondary | ICD-10-CM | POA: Diagnosis not present

## 2021-12-09 DIAGNOSIS — C61 Malignant neoplasm of prostate: Secondary | ICD-10-CM | POA: Diagnosis not present

## 2021-12-12 DIAGNOSIS — C61 Malignant neoplasm of prostate: Secondary | ICD-10-CM | POA: Diagnosis not present

## 2021-12-12 DIAGNOSIS — Z79818 Long term (current) use of other agents affecting estrogen receptors and estrogen levels: Secondary | ICD-10-CM | POA: Diagnosis not present

## 2021-12-12 DIAGNOSIS — Z5181 Encounter for therapeutic drug level monitoring: Secondary | ICD-10-CM | POA: Diagnosis not present

## 2021-12-13 DIAGNOSIS — C61 Malignant neoplasm of prostate: Secondary | ICD-10-CM | POA: Diagnosis not present

## 2021-12-13 DIAGNOSIS — Z5181 Encounter for therapeutic drug level monitoring: Secondary | ICD-10-CM | POA: Diagnosis not present

## 2021-12-13 DIAGNOSIS — Z79818 Long term (current) use of other agents affecting estrogen receptors and estrogen levels: Secondary | ICD-10-CM | POA: Diagnosis not present

## 2021-12-14 DIAGNOSIS — Z79818 Long term (current) use of other agents affecting estrogen receptors and estrogen levels: Secondary | ICD-10-CM | POA: Diagnosis not present

## 2021-12-14 DIAGNOSIS — Z5181 Encounter for therapeutic drug level monitoring: Secondary | ICD-10-CM | POA: Diagnosis not present

## 2021-12-14 DIAGNOSIS — C61 Malignant neoplasm of prostate: Secondary | ICD-10-CM | POA: Diagnosis not present

## 2021-12-15 DIAGNOSIS — C61 Malignant neoplasm of prostate: Secondary | ICD-10-CM | POA: Diagnosis not present

## 2021-12-15 DIAGNOSIS — Z79818 Long term (current) use of other agents affecting estrogen receptors and estrogen levels: Secondary | ICD-10-CM | POA: Diagnosis not present

## 2021-12-15 DIAGNOSIS — Z5181 Encounter for therapeutic drug level monitoring: Secondary | ICD-10-CM | POA: Diagnosis not present

## 2021-12-16 DIAGNOSIS — C61 Malignant neoplasm of prostate: Secondary | ICD-10-CM | POA: Diagnosis not present

## 2021-12-16 DIAGNOSIS — Z5181 Encounter for therapeutic drug level monitoring: Secondary | ICD-10-CM | POA: Diagnosis not present

## 2021-12-16 DIAGNOSIS — Z79818 Long term (current) use of other agents affecting estrogen receptors and estrogen levels: Secondary | ICD-10-CM | POA: Diagnosis not present

## 2021-12-19 DIAGNOSIS — C61 Malignant neoplasm of prostate: Secondary | ICD-10-CM | POA: Diagnosis not present

## 2021-12-19 DIAGNOSIS — Z79818 Long term (current) use of other agents affecting estrogen receptors and estrogen levels: Secondary | ICD-10-CM | POA: Diagnosis not present

## 2021-12-19 DIAGNOSIS — Z5181 Encounter for therapeutic drug level monitoring: Secondary | ICD-10-CM | POA: Diagnosis not present

## 2021-12-21 DIAGNOSIS — C61 Malignant neoplasm of prostate: Secondary | ICD-10-CM | POA: Diagnosis not present

## 2021-12-21 DIAGNOSIS — Z79818 Long term (current) use of other agents affecting estrogen receptors and estrogen levels: Secondary | ICD-10-CM | POA: Diagnosis not present

## 2021-12-21 DIAGNOSIS — Z5181 Encounter for therapeutic drug level monitoring: Secondary | ICD-10-CM | POA: Diagnosis not present

## 2021-12-22 DIAGNOSIS — Z5181 Encounter for therapeutic drug level monitoring: Secondary | ICD-10-CM | POA: Diagnosis not present

## 2021-12-22 DIAGNOSIS — C61 Malignant neoplasm of prostate: Secondary | ICD-10-CM | POA: Diagnosis not present

## 2021-12-22 DIAGNOSIS — Z79818 Long term (current) use of other agents affecting estrogen receptors and estrogen levels: Secondary | ICD-10-CM | POA: Diagnosis not present

## 2021-12-23 DIAGNOSIS — Z5181 Encounter for therapeutic drug level monitoring: Secondary | ICD-10-CM | POA: Diagnosis not present

## 2021-12-23 DIAGNOSIS — Z79818 Long term (current) use of other agents affecting estrogen receptors and estrogen levels: Secondary | ICD-10-CM | POA: Diagnosis not present

## 2021-12-23 DIAGNOSIS — C61 Malignant neoplasm of prostate: Secondary | ICD-10-CM | POA: Diagnosis not present

## 2021-12-26 DIAGNOSIS — C61 Malignant neoplasm of prostate: Secondary | ICD-10-CM | POA: Diagnosis not present

## 2021-12-26 DIAGNOSIS — C3490 Malignant neoplasm of unspecified part of unspecified bronchus or lung: Secondary | ICD-10-CM | POA: Diagnosis not present

## 2021-12-26 DIAGNOSIS — R918 Other nonspecific abnormal finding of lung field: Secondary | ICD-10-CM | POA: Diagnosis not present

## 2021-12-26 DIAGNOSIS — Z79818 Long term (current) use of other agents affecting estrogen receptors and estrogen levels: Secondary | ICD-10-CM | POA: Diagnosis not present

## 2021-12-26 DIAGNOSIS — Z5181 Encounter for therapeutic drug level monitoring: Secondary | ICD-10-CM | POA: Diagnosis not present

## 2021-12-26 DIAGNOSIS — C3492 Malignant neoplasm of unspecified part of left bronchus or lung: Secondary | ICD-10-CM | POA: Diagnosis not present

## 2021-12-27 DIAGNOSIS — Z5181 Encounter for therapeutic drug level monitoring: Secondary | ICD-10-CM | POA: Diagnosis not present

## 2021-12-27 DIAGNOSIS — Z79818 Long term (current) use of other agents affecting estrogen receptors and estrogen levels: Secondary | ICD-10-CM | POA: Diagnosis not present

## 2021-12-27 DIAGNOSIS — M5414 Radiculopathy, thoracic region: Secondary | ICD-10-CM | POA: Diagnosis not present

## 2021-12-27 DIAGNOSIS — C3490 Malignant neoplasm of unspecified part of unspecified bronchus or lung: Secondary | ICD-10-CM | POA: Diagnosis not present

## 2021-12-27 DIAGNOSIS — C61 Malignant neoplasm of prostate: Secondary | ICD-10-CM | POA: Diagnosis not present

## 2021-12-28 DIAGNOSIS — Z5181 Encounter for therapeutic drug level monitoring: Secondary | ICD-10-CM | POA: Diagnosis not present

## 2021-12-28 DIAGNOSIS — Z79818 Long term (current) use of other agents affecting estrogen receptors and estrogen levels: Secondary | ICD-10-CM | POA: Diagnosis not present

## 2021-12-28 DIAGNOSIS — C61 Malignant neoplasm of prostate: Secondary | ICD-10-CM | POA: Diagnosis not present

## 2021-12-29 DIAGNOSIS — C61 Malignant neoplasm of prostate: Secondary | ICD-10-CM | POA: Diagnosis not present

## 2021-12-29 DIAGNOSIS — Z5181 Encounter for therapeutic drug level monitoring: Secondary | ICD-10-CM | POA: Diagnosis not present

## 2021-12-29 DIAGNOSIS — Z79818 Long term (current) use of other agents affecting estrogen receptors and estrogen levels: Secondary | ICD-10-CM | POA: Diagnosis not present

## 2021-12-30 DIAGNOSIS — C61 Malignant neoplasm of prostate: Secondary | ICD-10-CM | POA: Diagnosis not present

## 2021-12-30 DIAGNOSIS — Z79818 Long term (current) use of other agents affecting estrogen receptors and estrogen levels: Secondary | ICD-10-CM | POA: Diagnosis not present

## 2021-12-30 DIAGNOSIS — Z5181 Encounter for therapeutic drug level monitoring: Secondary | ICD-10-CM | POA: Diagnosis not present

## 2022-01-02 DIAGNOSIS — Z5181 Encounter for therapeutic drug level monitoring: Secondary | ICD-10-CM | POA: Diagnosis not present

## 2022-01-02 DIAGNOSIS — Z79818 Long term (current) use of other agents affecting estrogen receptors and estrogen levels: Secondary | ICD-10-CM | POA: Diagnosis not present

## 2022-01-02 DIAGNOSIS — C61 Malignant neoplasm of prostate: Secondary | ICD-10-CM | POA: Diagnosis not present

## 2022-01-03 DIAGNOSIS — C61 Malignant neoplasm of prostate: Secondary | ICD-10-CM | POA: Diagnosis not present

## 2022-01-03 DIAGNOSIS — Z5181 Encounter for therapeutic drug level monitoring: Secondary | ICD-10-CM | POA: Diagnosis not present

## 2022-01-03 DIAGNOSIS — Z79818 Long term (current) use of other agents affecting estrogen receptors and estrogen levels: Secondary | ICD-10-CM | POA: Diagnosis not present

## 2022-01-04 DIAGNOSIS — Z79818 Long term (current) use of other agents affecting estrogen receptors and estrogen levels: Secondary | ICD-10-CM | POA: Diagnosis not present

## 2022-01-04 DIAGNOSIS — C61 Malignant neoplasm of prostate: Secondary | ICD-10-CM | POA: Diagnosis not present

## 2022-01-04 DIAGNOSIS — Z5181 Encounter for therapeutic drug level monitoring: Secondary | ICD-10-CM | POA: Diagnosis not present

## 2022-01-05 DIAGNOSIS — Z5181 Encounter for therapeutic drug level monitoring: Secondary | ICD-10-CM | POA: Diagnosis not present

## 2022-01-05 DIAGNOSIS — C61 Malignant neoplasm of prostate: Secondary | ICD-10-CM | POA: Diagnosis not present

## 2022-01-05 DIAGNOSIS — Z79818 Long term (current) use of other agents affecting estrogen receptors and estrogen levels: Secondary | ICD-10-CM | POA: Diagnosis not present

## 2022-01-06 DIAGNOSIS — C61 Malignant neoplasm of prostate: Secondary | ICD-10-CM | POA: Diagnosis not present

## 2022-01-06 DIAGNOSIS — Z79818 Long term (current) use of other agents affecting estrogen receptors and estrogen levels: Secondary | ICD-10-CM | POA: Diagnosis not present

## 2022-01-06 DIAGNOSIS — Z5181 Encounter for therapeutic drug level monitoring: Secondary | ICD-10-CM | POA: Diagnosis not present

## 2022-01-09 DIAGNOSIS — Z79818 Long term (current) use of other agents affecting estrogen receptors and estrogen levels: Secondary | ICD-10-CM | POA: Diagnosis not present

## 2022-01-09 DIAGNOSIS — C61 Malignant neoplasm of prostate: Secondary | ICD-10-CM | POA: Diagnosis not present

## 2022-01-09 DIAGNOSIS — Z5181 Encounter for therapeutic drug level monitoring: Secondary | ICD-10-CM | POA: Diagnosis not present

## 2022-01-10 DIAGNOSIS — C61 Malignant neoplasm of prostate: Secondary | ICD-10-CM | POA: Diagnosis not present

## 2022-01-10 DIAGNOSIS — Z5181 Encounter for therapeutic drug level monitoring: Secondary | ICD-10-CM | POA: Diagnosis not present

## 2022-01-10 DIAGNOSIS — Z79818 Long term (current) use of other agents affecting estrogen receptors and estrogen levels: Secondary | ICD-10-CM | POA: Diagnosis not present

## 2022-01-23 DIAGNOSIS — E785 Hyperlipidemia, unspecified: Secondary | ICD-10-CM | POA: Diagnosis not present

## 2022-01-23 DIAGNOSIS — Z87891 Personal history of nicotine dependence: Secondary | ICD-10-CM | POA: Diagnosis not present

## 2022-01-23 DIAGNOSIS — J90 Pleural effusion, not elsewhere classified: Secondary | ICD-10-CM | POA: Diagnosis not present

## 2022-01-23 DIAGNOSIS — J439 Emphysema, unspecified: Secondary | ICD-10-CM | POA: Diagnosis not present

## 2022-01-23 DIAGNOSIS — Z7951 Long term (current) use of inhaled steroids: Secondary | ICD-10-CM | POA: Diagnosis not present

## 2022-01-23 DIAGNOSIS — Z7901 Long term (current) use of anticoagulants: Secondary | ICD-10-CM | POA: Diagnosis not present

## 2022-01-23 DIAGNOSIS — Z888 Allergy status to other drugs, medicaments and biological substances status: Secondary | ICD-10-CM | POA: Diagnosis not present

## 2022-01-23 DIAGNOSIS — Z79899 Other long term (current) drug therapy: Secondary | ICD-10-CM | POA: Diagnosis not present

## 2022-02-09 DIAGNOSIS — C61 Malignant neoplasm of prostate: Secondary | ICD-10-CM | POA: Diagnosis not present

## 2022-02-15 DIAGNOSIS — C3490 Malignant neoplasm of unspecified part of unspecified bronchus or lung: Secondary | ICD-10-CM | POA: Diagnosis not present

## 2022-02-15 DIAGNOSIS — I3139 Other pericardial effusion (noninflammatory): Secondary | ICD-10-CM | POA: Diagnosis not present

## 2022-02-15 DIAGNOSIS — M7989 Other specified soft tissue disorders: Secondary | ICD-10-CM | POA: Diagnosis not present

## 2022-02-15 DIAGNOSIS — R600 Localized edema: Secondary | ICD-10-CM | POA: Diagnosis not present

## 2022-02-15 DIAGNOSIS — R079 Chest pain, unspecified: Secondary | ICD-10-CM | POA: Diagnosis not present

## 2022-02-15 DIAGNOSIS — E876 Hypokalemia: Secondary | ICD-10-CM | POA: Diagnosis not present

## 2022-02-15 DIAGNOSIS — K208 Other esophagitis without bleeding: Secondary | ICD-10-CM | POA: Diagnosis not present

## 2022-02-15 DIAGNOSIS — C61 Malignant neoplasm of prostate: Secondary | ICD-10-CM | POA: Diagnosis not present

## 2022-02-15 DIAGNOSIS — I2699 Other pulmonary embolism without acute cor pulmonale: Secondary | ICD-10-CM | POA: Diagnosis not present

## 2022-02-21 DIAGNOSIS — C3492 Malignant neoplasm of unspecified part of left bronchus or lung: Secondary | ICD-10-CM | POA: Diagnosis not present

## 2022-03-02 DIAGNOSIS — Z85118 Personal history of other malignant neoplasm of bronchus and lung: Secondary | ICD-10-CM | POA: Diagnosis not present

## 2022-03-02 DIAGNOSIS — J90 Pleural effusion, not elsewhere classified: Secondary | ICD-10-CM | POA: Diagnosis not present

## 2022-03-02 DIAGNOSIS — Z9889 Other specified postprocedural states: Secondary | ICD-10-CM | POA: Diagnosis not present

## 2022-03-02 DIAGNOSIS — R091 Pleurisy: Secondary | ICD-10-CM | POA: Diagnosis not present

## 2022-03-02 DIAGNOSIS — Z8546 Personal history of malignant neoplasm of prostate: Secondary | ICD-10-CM | POA: Diagnosis not present

## 2022-03-02 DIAGNOSIS — J939 Pneumothorax, unspecified: Secondary | ICD-10-CM | POA: Diagnosis not present

## 2022-03-02 DIAGNOSIS — C3490 Malignant neoplasm of unspecified part of unspecified bronchus or lung: Secondary | ICD-10-CM | POA: Diagnosis not present

## 2022-03-06 DIAGNOSIS — J910 Malignant pleural effusion: Secondary | ICD-10-CM | POA: Diagnosis not present

## 2022-03-06 DIAGNOSIS — C3490 Malignant neoplasm of unspecified part of unspecified bronchus or lung: Secondary | ICD-10-CM | POA: Diagnosis not present

## 2022-03-08 DIAGNOSIS — Z888 Allergy status to other drugs, medicaments and biological substances status: Secondary | ICD-10-CM | POA: Diagnosis not present

## 2022-03-08 DIAGNOSIS — Z79899 Other long term (current) drug therapy: Secondary | ICD-10-CM | POA: Diagnosis not present

## 2022-03-08 DIAGNOSIS — J449 Chronic obstructive pulmonary disease, unspecified: Secondary | ICD-10-CM | POA: Diagnosis not present

## 2022-03-08 DIAGNOSIS — E860 Dehydration: Secondary | ICD-10-CM | POA: Diagnosis not present

## 2022-03-08 DIAGNOSIS — E785 Hyperlipidemia, unspecified: Secondary | ICD-10-CM | POA: Diagnosis not present

## 2022-03-08 DIAGNOSIS — Z85118 Personal history of other malignant neoplasm of bronchus and lung: Secondary | ICD-10-CM | POA: Diagnosis not present

## 2022-03-08 DIAGNOSIS — R531 Weakness: Secondary | ICD-10-CM | POA: Diagnosis not present

## 2022-03-08 DIAGNOSIS — Z8546 Personal history of malignant neoplasm of prostate: Secondary | ICD-10-CM | POA: Diagnosis not present

## 2022-03-08 DIAGNOSIS — J90 Pleural effusion, not elsewhere classified: Secondary | ICD-10-CM | POA: Diagnosis not present

## 2022-03-08 DIAGNOSIS — Z7901 Long term (current) use of anticoagulants: Secondary | ICD-10-CM | POA: Diagnosis not present

## 2022-03-23 ENCOUNTER — Telehealth: Payer: Self-pay

## 2022-03-23 NOTE — Patient Outreach (Signed)
  Care Coordination   Initial Visit Note   03/23/2022 Name: BROOKS KINNAN MRN: [id number] DOB: 1951/02/13  Betsy Pries is a 71 y.o. year old male who sees Cyndi Bender, [place] for primary care. I  placed call to MD office and spoke with [facility] who transferred me to [facility].  What matters to the patients health and wellness today?  Patient needs refills on medications and does not know when his next appointment is.    Goals Addressed               This Visit's Progress     I need help getting my refills (pt-stated)        Care Coordination Interventions: Placed call to [facility]. Spoke with Dorian Pod and reviewed patients concern Confirmed pharmacy Inquired about next scheduled office visit        SDOH assessments and interventions completed:  No     Care Coordination Interventions Activated:  Yes  Care Coordination Interventions:  Yes, provided   Follow up plan:  will follow up with patient    Encounter Outcome:  Pt. Visit Completed   Tomasa Rand, RN, BSN, CEN [place] Coordinator [phone]

## 2022-03-23 NOTE — Patient Outreach (Signed)
  Care Coordination   Initial Visit Note   03/23/2022 Name: Jason Leon MRN: [id number] DOB: February 15, 1951  Jason Leon is a 71 y.o. year old male who sees Cyndi Bender, [place] for primary care. I spoke with  Jason Leon by phone today.  What matters to the patients health and wellness today?  Placed call to patient to explain and offer [facility] care coordination program.  Patient reports difficulty getting his refills of Atorvastatin and  Gabapentin.    Goals Addressed               This Visit's Progress     I need help getting my refills (pt-stated)        Care Coordination Interventions: Patient reports difficultly with getting his refills. States that [facility] in [place] told patient to call MD office. Patient asked if I could help him with this        SDOH assessments and interventions completed:  No     Care Coordination Interventions Activated:  Yes  Care Coordination Interventions:  Yes, provided   Follow up plan:  follow up with MD office    Encounter Outcome:  Pt. Visit Completed   Tomasa Rand RN, BSN, CEN RN Case Freight forwarder for [employer] Mobile: [phone]

## 2022-03-23 NOTE — Patient Outreach (Signed)
  Care Coordination   Initial Visit Note   03/23/2022 Name: PIPER ALBRO MRN: [id number] DOB: 01/20/1951  Betsy Pries is a 71 y.o. year old male who sees Cyndi Bender, [place] for primary care. I spoke with  Betsy Pries by phone today.  What matters to the patients health and wellness today?  Update to patient.  Patient informed that MD office will send in refills. Patient also informed of pending MD follow up on 04/03/2022  at 815 am    Goals Addressed               This Visit's Progress     I need help getting my refills (pt-stated)        Care Coordination Interventions: Update provided to patient that MD office did not receive a request from [facility]. Reviewed with patient that MD will send in RX Notified patient of when his next office is ---OCtober 16 at 815. Patient denies any other concerns.        SDOH assessments and interventions completed:  No     Care Coordination Interventions Activated:  Yes  Care Coordination Interventions:  Yes, provided   Follow up plan: No further intervention required.   Encounter Outcome:  Pt. Visit Completed   Tomasa Rand, RN, BSN, CEN [place] Coordinator [phone]

## 2022-03-27 DIAGNOSIS — J90 Pleural effusion, not elsewhere classified: Secondary | ICD-10-CM | POA: Diagnosis not present

## 2022-03-27 DIAGNOSIS — C3490 Malignant neoplasm of unspecified part of unspecified bronchus or lung: Secondary | ICD-10-CM | POA: Diagnosis not present

## 2022-03-27 DIAGNOSIS — C61 Malignant neoplasm of prostate: Secondary | ICD-10-CM | POA: Diagnosis not present

## 2022-03-27 DIAGNOSIS — R918 Other nonspecific abnormal finding of lung field: Secondary | ICD-10-CM | POA: Diagnosis not present

## 2022-03-28 DIAGNOSIS — Z85118 Personal history of other malignant neoplasm of bronchus and lung: Secondary | ICD-10-CM | POA: Diagnosis not present

## 2022-03-28 DIAGNOSIS — C61 Malignant neoplasm of prostate: Secondary | ICD-10-CM | POA: Diagnosis not present

## 2022-03-28 DIAGNOSIS — J90 Pleural effusion, not elsewhere classified: Secondary | ICD-10-CM | POA: Diagnosis not present

## 2022-03-28 DIAGNOSIS — Z23 Encounter for immunization: Secondary | ICD-10-CM | POA: Diagnosis not present

## 2022-03-28 DIAGNOSIS — Z08 Encounter for follow-up examination after completed treatment for malignant neoplasm: Secondary | ICD-10-CM | POA: Diagnosis not present

## 2022-03-28 DIAGNOSIS — G8929 Other chronic pain: Secondary | ICD-10-CM | POA: Diagnosis not present

## 2022-03-28 DIAGNOSIS — Z923 Personal history of irradiation: Secondary | ICD-10-CM | POA: Diagnosis not present

## 2022-03-28 DIAGNOSIS — E785 Hyperlipidemia, unspecified: Secondary | ICD-10-CM | POA: Diagnosis not present

## 2022-03-28 DIAGNOSIS — Z87891 Personal history of nicotine dependence: Secondary | ICD-10-CM | POA: Diagnosis not present

## 2022-03-28 DIAGNOSIS — J449 Chronic obstructive pulmonary disease, unspecified: Secondary | ICD-10-CM | POA: Diagnosis not present

## 2022-04-03 DIAGNOSIS — F419 Anxiety disorder, unspecified: Secondary | ICD-10-CM | POA: Diagnosis not present

## 2022-04-03 DIAGNOSIS — C61 Malignant neoplasm of prostate: Secondary | ICD-10-CM | POA: Diagnosis not present

## 2022-04-03 DIAGNOSIS — E7800 Pure hypercholesterolemia, unspecified: Secondary | ICD-10-CM | POA: Diagnosis not present

## 2022-04-03 DIAGNOSIS — Z1331 Encounter for screening for depression: Secondary | ICD-10-CM | POA: Diagnosis not present

## 2022-04-03 DIAGNOSIS — R7303 Prediabetes: Secondary | ICD-10-CM | POA: Diagnosis not present

## 2022-04-03 DIAGNOSIS — Z86711 Personal history of pulmonary embolism: Secondary | ICD-10-CM | POA: Diagnosis not present

## 2022-04-03 DIAGNOSIS — Z79899 Other long term (current) drug therapy: Secondary | ICD-10-CM | POA: Diagnosis not present

## 2022-04-03 DIAGNOSIS — J439 Emphysema, unspecified: Secondary | ICD-10-CM | POA: Diagnosis not present

## 2022-04-03 DIAGNOSIS — C3490 Malignant neoplasm of unspecified part of unspecified bronchus or lung: Secondary | ICD-10-CM | POA: Diagnosis not present

## 2022-04-12 DIAGNOSIS — Z79899 Other long term (current) drug therapy: Secondary | ICD-10-CM | POA: Diagnosis not present

## 2022-04-12 DIAGNOSIS — Z7901 Long term (current) use of anticoagulants: Secondary | ICD-10-CM | POA: Diagnosis not present

## 2022-04-12 DIAGNOSIS — Z79890 Hormone replacement therapy (postmenopausal): Secondary | ICD-10-CM | POA: Diagnosis not present

## 2022-04-12 DIAGNOSIS — J449 Chronic obstructive pulmonary disease, unspecified: Secondary | ICD-10-CM | POA: Diagnosis not present

## 2022-04-12 DIAGNOSIS — Z87891 Personal history of nicotine dependence: Secondary | ICD-10-CM | POA: Diagnosis not present

## 2022-04-12 DIAGNOSIS — E785 Hyperlipidemia, unspecified: Secondary | ICD-10-CM | POA: Diagnosis not present

## 2022-04-12 DIAGNOSIS — Z85828 Personal history of other malignant neoplasm of skin: Secondary | ICD-10-CM | POA: Diagnosis not present

## 2022-04-12 DIAGNOSIS — Z4682 Encounter for fitting and adjustment of non-vascular catheter: Secondary | ICD-10-CM | POA: Diagnosis not present

## 2022-04-21 DIAGNOSIS — R609 Edema, unspecified: Secondary | ICD-10-CM | POA: Diagnosis not present

## 2022-04-21 DIAGNOSIS — R7303 Prediabetes: Secondary | ICD-10-CM | POA: Diagnosis not present

## 2022-05-10 DIAGNOSIS — C61 Malignant neoplasm of prostate: Secondary | ICD-10-CM | POA: Diagnosis not present

## 2022-05-10 DIAGNOSIS — C3490 Malignant neoplasm of unspecified part of unspecified bronchus or lung: Secondary | ICD-10-CM | POA: Diagnosis not present

## 2022-05-10 DIAGNOSIS — D649 Anemia, unspecified: Secondary | ICD-10-CM | POA: Diagnosis not present

## 2022-05-10 DIAGNOSIS — E876 Hypokalemia: Secondary | ICD-10-CM | POA: Diagnosis not present

## 2022-05-24 DIAGNOSIS — R609 Edema, unspecified: Secondary | ICD-10-CM | POA: Diagnosis not present

## 2022-06-06 DIAGNOSIS — H52223 Regular astigmatism, bilateral: Secondary | ICD-10-CM | POA: Diagnosis not present

## 2022-06-06 DIAGNOSIS — H5203 Hypermetropia, bilateral: Secondary | ICD-10-CM | POA: Diagnosis not present

## 2022-06-06 DIAGNOSIS — H2513 Age-related nuclear cataract, bilateral: Secondary | ICD-10-CM | POA: Diagnosis not present

## 2022-06-20 DIAGNOSIS — C61 Malignant neoplasm of prostate: Secondary | ICD-10-CM | POA: Diagnosis not present

## 2022-06-20 DIAGNOSIS — I2699 Other pulmonary embolism without acute cor pulmonale: Secondary | ICD-10-CM | POA: Diagnosis not present

## 2022-06-20 DIAGNOSIS — Z0001 Encounter for general adult medical examination with abnormal findings: Secondary | ICD-10-CM | POA: Diagnosis not present

## 2022-06-20 DIAGNOSIS — C3492 Malignant neoplasm of unspecified part of left bronchus or lung: Secondary | ICD-10-CM | POA: Diagnosis not present

## 2022-06-20 DIAGNOSIS — D509 Iron deficiency anemia, unspecified: Secondary | ICD-10-CM | POA: Diagnosis not present

## 2022-06-20 DIAGNOSIS — R609 Edema, unspecified: Secondary | ICD-10-CM | POA: Diagnosis not present

## 2022-06-20 DIAGNOSIS — E039 Hypothyroidism, unspecified: Secondary | ICD-10-CM | POA: Diagnosis not present

## 2022-06-26 DIAGNOSIS — I3139 Other pericardial effusion (noninflammatory): Secondary | ICD-10-CM | POA: Diagnosis not present

## 2022-06-26 DIAGNOSIS — C3490 Malignant neoplasm of unspecified part of unspecified bronchus or lung: Secondary | ICD-10-CM | POA: Diagnosis not present

## 2022-06-26 DIAGNOSIS — E039 Hypothyroidism, unspecified: Secondary | ICD-10-CM | POA: Diagnosis not present

## 2022-06-26 DIAGNOSIS — C3411 Malignant neoplasm of upper lobe, right bronchus or lung: Secondary | ICD-10-CM | POA: Diagnosis not present

## 2022-06-26 DIAGNOSIS — C3412 Malignant neoplasm of upper lobe, left bronchus or lung: Secondary | ICD-10-CM | POA: Diagnosis not present

## 2022-06-26 DIAGNOSIS — J90 Pleural effusion, not elsewhere classified: Secondary | ICD-10-CM | POA: Diagnosis not present

## 2022-06-27 DIAGNOSIS — Z85118 Personal history of other malignant neoplasm of bronchus and lung: Secondary | ICD-10-CM | POA: Diagnosis not present

## 2022-06-27 DIAGNOSIS — D649 Anemia, unspecified: Secondary | ICD-10-CM | POA: Diagnosis not present

## 2022-06-27 DIAGNOSIS — Z87891 Personal history of nicotine dependence: Secondary | ICD-10-CM | POA: Diagnosis not present

## 2022-06-27 DIAGNOSIS — C3490 Malignant neoplasm of unspecified part of unspecified bronchus or lung: Secondary | ICD-10-CM | POA: Diagnosis not present

## 2022-06-27 DIAGNOSIS — J449 Chronic obstructive pulmonary disease, unspecified: Secondary | ICD-10-CM | POA: Diagnosis not present

## 2022-06-27 DIAGNOSIS — E876 Hypokalemia: Secondary | ICD-10-CM | POA: Diagnosis not present

## 2022-06-27 DIAGNOSIS — Z08 Encounter for follow-up examination after completed treatment for malignant neoplasm: Secondary | ICD-10-CM | POA: Diagnosis not present

## 2022-06-27 DIAGNOSIS — C61 Malignant neoplasm of prostate: Secondary | ICD-10-CM | POA: Diagnosis not present

## 2022-06-27 DIAGNOSIS — J90 Pleural effusion, not elsewhere classified: Secondary | ICD-10-CM | POA: Diagnosis not present

## 2022-06-27 DIAGNOSIS — E785 Hyperlipidemia, unspecified: Secondary | ICD-10-CM | POA: Diagnosis not present

## 2022-06-27 DIAGNOSIS — R5383 Other fatigue: Secondary | ICD-10-CM | POA: Diagnosis not present

## 2022-08-08 DIAGNOSIS — C61 Malignant neoplasm of prostate: Secondary | ICD-10-CM | POA: Diagnosis not present

## 2022-08-14 DIAGNOSIS — Z1212 Encounter for screening for malignant neoplasm of rectum: Secondary | ICD-10-CM | POA: Diagnosis not present

## 2022-08-14 DIAGNOSIS — Z1211 Encounter for screening for malignant neoplasm of colon: Secondary | ICD-10-CM | POA: Diagnosis not present

## 2022-09-25 DIAGNOSIS — Z888 Allergy status to other drugs, medicaments and biological substances status: Secondary | ICD-10-CM | POA: Diagnosis not present

## 2022-09-25 DIAGNOSIS — Z79899 Other long term (current) drug therapy: Secondary | ICD-10-CM | POA: Diagnosis not present

## 2022-09-25 DIAGNOSIS — J90 Pleural effusion, not elsewhere classified: Secondary | ICD-10-CM | POA: Diagnosis not present

## 2022-10-09 DIAGNOSIS — Z86711 Personal history of pulmonary embolism: Secondary | ICD-10-CM | POA: Diagnosis not present

## 2022-10-09 DIAGNOSIS — Z9181 History of falling: Secondary | ICD-10-CM | POA: Diagnosis not present

## 2022-10-09 DIAGNOSIS — Z79899 Other long term (current) drug therapy: Secondary | ICD-10-CM | POA: Diagnosis not present

## 2022-10-09 DIAGNOSIS — C61 Malignant neoplasm of prostate: Secondary | ICD-10-CM | POA: Diagnosis not present

## 2022-10-09 DIAGNOSIS — Z139 Encounter for screening, unspecified: Secondary | ICD-10-CM | POA: Diagnosis not present

## 2022-10-09 DIAGNOSIS — E039 Hypothyroidism, unspecified: Secondary | ICD-10-CM | POA: Diagnosis not present

## 2022-10-09 DIAGNOSIS — R7303 Prediabetes: Secondary | ICD-10-CM | POA: Diagnosis not present

## 2022-10-09 DIAGNOSIS — C3490 Malignant neoplasm of unspecified part of unspecified bronchus or lung: Secondary | ICD-10-CM | POA: Diagnosis not present

## 2022-10-09 DIAGNOSIS — F419 Anxiety disorder, unspecified: Secondary | ICD-10-CM | POA: Diagnosis not present

## 2022-10-09 DIAGNOSIS — E7800 Pure hypercholesterolemia, unspecified: Secondary | ICD-10-CM | POA: Diagnosis not present

## 2022-10-09 DIAGNOSIS — J439 Emphysema, unspecified: Secondary | ICD-10-CM | POA: Diagnosis not present

## 2022-10-12 DIAGNOSIS — D509 Iron deficiency anemia, unspecified: Secondary | ICD-10-CM | POA: Diagnosis not present

## 2022-10-12 DIAGNOSIS — Z86010 Personal history of colonic polyps: Secondary | ICD-10-CM | POA: Diagnosis not present

## 2022-10-12 DIAGNOSIS — Z7901 Long term (current) use of anticoagulants: Secondary | ICD-10-CM | POA: Diagnosis not present

## 2022-10-24 DIAGNOSIS — I2699 Other pulmonary embolism without acute cor pulmonale: Secondary | ICD-10-CM | POA: Diagnosis not present

## 2022-10-24 DIAGNOSIS — R799 Abnormal finding of blood chemistry, unspecified: Secondary | ICD-10-CM | POA: Diagnosis not present

## 2022-10-24 DIAGNOSIS — G2581 Restless legs syndrome: Secondary | ICD-10-CM | POA: Diagnosis not present

## 2022-10-24 DIAGNOSIS — C61 Malignant neoplasm of prostate: Secondary | ICD-10-CM | POA: Diagnosis not present

## 2022-10-24 DIAGNOSIS — D649 Anemia, unspecified: Secondary | ICD-10-CM | POA: Diagnosis not present

## 2022-10-24 DIAGNOSIS — R109 Unspecified abdominal pain: Secondary | ICD-10-CM | POA: Diagnosis not present

## 2022-10-24 DIAGNOSIS — E039 Hypothyroidism, unspecified: Secondary | ICD-10-CM | POA: Diagnosis not present

## 2022-10-24 DIAGNOSIS — R9389 Abnormal findings on diagnostic imaging of other specified body structures: Secondary | ICD-10-CM | POA: Diagnosis not present

## 2022-10-24 DIAGNOSIS — C3492 Malignant neoplasm of unspecified part of left bronchus or lung: Secondary | ICD-10-CM | POA: Diagnosis not present

## 2022-11-09 DIAGNOSIS — C61 Malignant neoplasm of prostate: Secondary | ICD-10-CM | POA: Diagnosis not present

## 2022-11-10 DIAGNOSIS — Z0000 Encounter for general adult medical examination without abnormal findings: Secondary | ICD-10-CM | POA: Diagnosis not present

## 2022-11-10 DIAGNOSIS — Z7189 Other specified counseling: Secondary | ICD-10-CM | POA: Diagnosis not present

## 2022-11-28 DIAGNOSIS — J90 Pleural effusion, not elsewhere classified: Secondary | ICD-10-CM | POA: Diagnosis not present

## 2022-11-28 DIAGNOSIS — E279 Disorder of adrenal gland, unspecified: Secondary | ICD-10-CM | POA: Diagnosis not present

## 2022-11-28 DIAGNOSIS — I3139 Other pericardial effusion (noninflammatory): Secondary | ICD-10-CM | POA: Diagnosis not present

## 2022-11-28 DIAGNOSIS — Z923 Personal history of irradiation: Secondary | ICD-10-CM | POA: Diagnosis not present

## 2022-11-28 DIAGNOSIS — C3492 Malignant neoplasm of unspecified part of left bronchus or lung: Secondary | ICD-10-CM | POA: Diagnosis not present

## 2022-12-08 DIAGNOSIS — E278 Other specified disorders of adrenal gland: Secondary | ICD-10-CM | POA: Diagnosis not present

## 2022-12-08 DIAGNOSIS — Z85118 Personal history of other malignant neoplasm of bronchus and lung: Secondary | ICD-10-CM | POA: Diagnosis not present

## 2022-12-26 DIAGNOSIS — Z923 Personal history of irradiation: Secondary | ICD-10-CM | POA: Diagnosis not present

## 2022-12-26 DIAGNOSIS — Z86711 Personal history of pulmonary embolism: Secondary | ICD-10-CM | POA: Diagnosis not present

## 2022-12-26 DIAGNOSIS — Z87891 Personal history of nicotine dependence: Secondary | ICD-10-CM | POA: Diagnosis not present

## 2022-12-26 DIAGNOSIS — E785 Hyperlipidemia, unspecified: Secondary | ICD-10-CM | POA: Diagnosis not present

## 2022-12-26 DIAGNOSIS — J449 Chronic obstructive pulmonary disease, unspecified: Secondary | ICD-10-CM | POA: Diagnosis not present

## 2022-12-26 DIAGNOSIS — C61 Malignant neoplasm of prostate: Secondary | ICD-10-CM | POA: Diagnosis not present

## 2022-12-26 DIAGNOSIS — Z8042 Family history of malignant neoplasm of prostate: Secondary | ICD-10-CM | POA: Diagnosis not present

## 2022-12-26 DIAGNOSIS — Z801 Family history of malignant neoplasm of trachea, bronchus and lung: Secondary | ICD-10-CM | POA: Diagnosis not present

## 2022-12-26 DIAGNOSIS — C3490 Malignant neoplasm of unspecified part of unspecified bronchus or lung: Secondary | ICD-10-CM | POA: Diagnosis not present

## 2023-01-09 ENCOUNTER — Ambulatory Visit: Admit: 2023-01-09 | Payer: Medicare PPO

## 2023-01-09 SURGERY — COLONOSCOPY WITH PROPOFOL
Anesthesia: General

## 2023-01-14 DIAGNOSIS — R300 Dysuria: Secondary | ICD-10-CM | POA: Diagnosis not present

## 2023-01-15 DIAGNOSIS — C3432 Malignant neoplasm of lower lobe, left bronchus or lung: Secondary | ICD-10-CM | POA: Diagnosis not present

## 2023-01-15 DIAGNOSIS — R935 Abnormal findings on diagnostic imaging of other abdominal regions, including retroperitoneum: Secondary | ICD-10-CM | POA: Diagnosis not present

## 2023-01-15 DIAGNOSIS — J90 Pleural effusion, not elsewhere classified: Secondary | ICD-10-CM | POA: Diagnosis not present

## 2023-01-16 DIAGNOSIS — Z7901 Long term (current) use of anticoagulants: Secondary | ICD-10-CM | POA: Diagnosis not present

## 2023-01-16 DIAGNOSIS — Z801 Family history of malignant neoplasm of trachea, bronchus and lung: Secondary | ICD-10-CM | POA: Diagnosis not present

## 2023-01-16 DIAGNOSIS — R079 Chest pain, unspecified: Secondary | ICD-10-CM | POA: Diagnosis not present

## 2023-01-16 DIAGNOSIS — R5383 Other fatigue: Secondary | ICD-10-CM | POA: Diagnosis not present

## 2023-01-16 DIAGNOSIS — M25512 Pain in left shoulder: Secondary | ICD-10-CM | POA: Diagnosis not present

## 2023-01-16 DIAGNOSIS — C7972 Secondary malignant neoplasm of left adrenal gland: Secondary | ICD-10-CM | POA: Diagnosis not present

## 2023-01-16 DIAGNOSIS — Z8042 Family history of malignant neoplasm of prostate: Secondary | ICD-10-CM | POA: Diagnosis not present

## 2023-01-16 DIAGNOSIS — C3492 Malignant neoplasm of unspecified part of left bronchus or lung: Secondary | ICD-10-CM | POA: Diagnosis not present

## 2023-01-16 DIAGNOSIS — E039 Hypothyroidism, unspecified: Secondary | ICD-10-CM | POA: Diagnosis not present

## 2023-02-18 DIAGNOSIS — J45909 Unspecified asthma, uncomplicated: Secondary | ICD-10-CM | POA: Diagnosis not present

## 2023-02-18 DIAGNOSIS — L259 Unspecified contact dermatitis, unspecified cause: Secondary | ICD-10-CM | POA: Diagnosis not present

## 2023-02-18 DIAGNOSIS — F419 Anxiety disorder, unspecified: Secondary | ICD-10-CM | POA: Diagnosis not present

## 2023-02-26 DIAGNOSIS — E278 Other specified disorders of adrenal gland: Secondary | ICD-10-CM | POA: Diagnosis not present

## 2023-02-26 DIAGNOSIS — C7972 Secondary malignant neoplasm of left adrenal gland: Secondary | ICD-10-CM | POA: Diagnosis not present

## 2023-02-26 DIAGNOSIS — E039 Hypothyroidism, unspecified: Secondary | ICD-10-CM | POA: Diagnosis not present

## 2023-02-26 DIAGNOSIS — E785 Hyperlipidemia, unspecified: Secondary | ICD-10-CM | POA: Diagnosis not present

## 2023-02-26 DIAGNOSIS — C3490 Malignant neoplasm of unspecified part of unspecified bronchus or lung: Secondary | ICD-10-CM | POA: Diagnosis not present

## 2023-02-26 DIAGNOSIS — C3492 Malignant neoplasm of unspecified part of left bronchus or lung: Secondary | ICD-10-CM | POA: Diagnosis not present

## 2023-02-26 DIAGNOSIS — J449 Chronic obstructive pulmonary disease, unspecified: Secondary | ICD-10-CM | POA: Diagnosis not present

## 2023-03-13 DIAGNOSIS — C7972 Secondary malignant neoplasm of left adrenal gland: Secondary | ICD-10-CM | POA: Diagnosis not present

## 2023-03-13 DIAGNOSIS — Z09 Encounter for follow-up examination after completed treatment for conditions other than malignant neoplasm: Secondary | ICD-10-CM | POA: Diagnosis not present

## 2023-04-12 DIAGNOSIS — I2699 Other pulmonary embolism without acute cor pulmonale: Secondary | ICD-10-CM | POA: Diagnosis not present

## 2023-04-12 DIAGNOSIS — D630 Anemia in neoplastic disease: Secondary | ICD-10-CM | POA: Diagnosis not present

## 2023-04-12 DIAGNOSIS — R079 Chest pain, unspecified: Secondary | ICD-10-CM | POA: Diagnosis not present

## 2023-04-12 DIAGNOSIS — R109 Unspecified abdominal pain: Secondary | ICD-10-CM | POA: Diagnosis not present

## 2023-04-12 DIAGNOSIS — T66XXXA Radiation sickness, unspecified, initial encounter: Secondary | ICD-10-CM | POA: Diagnosis not present

## 2023-04-12 DIAGNOSIS — C7970 Secondary malignant neoplasm of unspecified adrenal gland: Secondary | ICD-10-CM | POA: Diagnosis not present

## 2023-04-12 DIAGNOSIS — M546 Pain in thoracic spine: Secondary | ICD-10-CM | POA: Diagnosis not present

## 2023-04-12 DIAGNOSIS — C3492 Malignant neoplasm of unspecified part of left bronchus or lung: Secondary | ICD-10-CM | POA: Diagnosis not present

## 2023-04-12 DIAGNOSIS — Z79899 Other long term (current) drug therapy: Secondary | ICD-10-CM | POA: Diagnosis not present

## 2023-04-12 DIAGNOSIS — E876 Hypokalemia: Secondary | ICD-10-CM | POA: Diagnosis not present

## 2023-04-12 DIAGNOSIS — K208 Other esophagitis without bleeding: Secondary | ICD-10-CM | POA: Diagnosis not present

## 2023-04-12 DIAGNOSIS — R519 Headache, unspecified: Secondary | ICD-10-CM | POA: Diagnosis not present

## 2023-04-17 DIAGNOSIS — J449 Chronic obstructive pulmonary disease, unspecified: Secondary | ICD-10-CM | POA: Diagnosis not present

## 2023-04-17 DIAGNOSIS — M546 Pain in thoracic spine: Secondary | ICD-10-CM | POA: Diagnosis not present

## 2023-04-17 DIAGNOSIS — E785 Hyperlipidemia, unspecified: Secondary | ICD-10-CM | POA: Diagnosis not present

## 2023-04-17 DIAGNOSIS — I2699 Other pulmonary embolism without acute cor pulmonale: Secondary | ICD-10-CM | POA: Diagnosis not present

## 2023-04-17 DIAGNOSIS — T66XXXA Radiation sickness, unspecified, initial encounter: Secondary | ICD-10-CM | POA: Diagnosis not present

## 2023-04-17 DIAGNOSIS — K208 Other esophagitis without bleeding: Secondary | ICD-10-CM | POA: Diagnosis not present

## 2023-04-17 DIAGNOSIS — C3490 Malignant neoplasm of unspecified part of unspecified bronchus or lung: Secondary | ICD-10-CM | POA: Diagnosis not present

## 2023-04-17 DIAGNOSIS — C7970 Secondary malignant neoplasm of unspecified adrenal gland: Secondary | ICD-10-CM | POA: Diagnosis not present

## 2023-04-17 DIAGNOSIS — C61 Malignant neoplasm of prostate: Secondary | ICD-10-CM | POA: Diagnosis not present

## 2023-04-17 DIAGNOSIS — C3492 Malignant neoplasm of unspecified part of left bronchus or lung: Secondary | ICD-10-CM | POA: Diagnosis not present

## 2023-04-24 DIAGNOSIS — C61 Malignant neoplasm of prostate: Secondary | ICD-10-CM | POA: Diagnosis not present

## 2023-04-24 DIAGNOSIS — R799 Abnormal finding of blood chemistry, unspecified: Secondary | ICD-10-CM | POA: Diagnosis not present

## 2023-04-24 DIAGNOSIS — Z0000 Encounter for general adult medical examination without abnormal findings: Secondary | ICD-10-CM | POA: Diagnosis not present

## 2023-04-26 DIAGNOSIS — C61 Malignant neoplasm of prostate: Secondary | ICD-10-CM | POA: Diagnosis not present

## 2023-04-26 DIAGNOSIS — R799 Abnormal finding of blood chemistry, unspecified: Secondary | ICD-10-CM | POA: Diagnosis not present

## 2023-04-26 DIAGNOSIS — R109 Unspecified abdominal pain: Secondary | ICD-10-CM | POA: Diagnosis not present

## 2023-04-26 DIAGNOSIS — I2699 Other pulmonary embolism without acute cor pulmonale: Secondary | ICD-10-CM | POA: Diagnosis not present

## 2023-05-03 DIAGNOSIS — C61 Malignant neoplasm of prostate: Secondary | ICD-10-CM | POA: Diagnosis not present

## 2023-05-03 DIAGNOSIS — E7800 Pure hypercholesterolemia, unspecified: Secondary | ICD-10-CM | POA: Diagnosis not present

## 2023-05-03 DIAGNOSIS — F419 Anxiety disorder, unspecified: Secondary | ICD-10-CM | POA: Diagnosis not present

## 2023-05-03 DIAGNOSIS — Z79899 Other long term (current) drug therapy: Secondary | ICD-10-CM | POA: Diagnosis not present

## 2023-05-03 DIAGNOSIS — C3490 Malignant neoplasm of unspecified part of unspecified bronchus or lung: Secondary | ICD-10-CM | POA: Diagnosis not present

## 2023-05-03 DIAGNOSIS — R7303 Prediabetes: Secondary | ICD-10-CM | POA: Diagnosis not present

## 2023-05-03 DIAGNOSIS — Z23 Encounter for immunization: Secondary | ICD-10-CM | POA: Diagnosis not present

## 2023-05-03 DIAGNOSIS — Z1331 Encounter for screening for depression: Secondary | ICD-10-CM | POA: Diagnosis not present

## 2023-05-03 DIAGNOSIS — Z86711 Personal history of pulmonary embolism: Secondary | ICD-10-CM | POA: Diagnosis not present

## 2023-05-03 DIAGNOSIS — J439 Emphysema, unspecified: Secondary | ICD-10-CM | POA: Diagnosis not present

## 2023-05-04 DIAGNOSIS — C3492 Malignant neoplasm of unspecified part of left bronchus or lung: Secondary | ICD-10-CM | POA: Diagnosis not present

## 2023-05-18 DIAGNOSIS — C3492 Malignant neoplasm of unspecified part of left bronchus or lung: Secondary | ICD-10-CM | POA: Diagnosis not present

## 2023-07-13 DIAGNOSIS — Z85118 Personal history of other malignant neoplasm of bronchus and lung: Secondary | ICD-10-CM | POA: Diagnosis not present

## 2023-07-13 DIAGNOSIS — Z923 Personal history of irradiation: Secondary | ICD-10-CM | POA: Diagnosis not present

## 2023-07-13 DIAGNOSIS — C3490 Malignant neoplasm of unspecified part of unspecified bronchus or lung: Secondary | ICD-10-CM | POA: Diagnosis not present

## 2023-07-13 DIAGNOSIS — Z08 Encounter for follow-up examination after completed treatment for malignant neoplasm: Secondary | ICD-10-CM | POA: Diagnosis not present

## 2023-07-17 DIAGNOSIS — Z9221 Personal history of antineoplastic chemotherapy: Secondary | ICD-10-CM | POA: Diagnosis not present

## 2023-07-17 DIAGNOSIS — C3490 Malignant neoplasm of unspecified part of unspecified bronchus or lung: Secondary | ICD-10-CM | POA: Diagnosis not present

## 2023-07-17 DIAGNOSIS — Z8546 Personal history of malignant neoplasm of prostate: Secondary | ICD-10-CM | POA: Diagnosis not present

## 2023-07-17 DIAGNOSIS — J449 Chronic obstructive pulmonary disease, unspecified: Secondary | ICD-10-CM | POA: Diagnosis not present

## 2023-07-17 DIAGNOSIS — E785 Hyperlipidemia, unspecified: Secondary | ICD-10-CM | POA: Diagnosis not present

## 2023-07-17 DIAGNOSIS — Z08 Encounter for follow-up examination after completed treatment for malignant neoplasm: Secondary | ICD-10-CM | POA: Diagnosis not present

## 2023-07-17 DIAGNOSIS — Z85118 Personal history of other malignant neoplasm of bronchus and lung: Secondary | ICD-10-CM | POA: Diagnosis not present

## 2023-07-17 DIAGNOSIS — Z9089 Acquired absence of other organs: Secondary | ICD-10-CM | POA: Diagnosis not present

## 2023-07-17 DIAGNOSIS — Z87891 Personal history of nicotine dependence: Secondary | ICD-10-CM | POA: Diagnosis not present

## 2023-07-17 DIAGNOSIS — Z923 Personal history of irradiation: Secondary | ICD-10-CM | POA: Diagnosis not present

## 2023-07-30 DIAGNOSIS — J019 Acute sinusitis, unspecified: Secondary | ICD-10-CM | POA: Diagnosis not present

## 2023-07-30 DIAGNOSIS — R0981 Nasal congestion: Secondary | ICD-10-CM | POA: Diagnosis not present

## 2023-07-30 DIAGNOSIS — R051 Acute cough: Secondary | ICD-10-CM | POA: Diagnosis not present

## 2023-08-02 DIAGNOSIS — H43399 Other vitreous opacities, unspecified eye: Secondary | ICD-10-CM | POA: Diagnosis not present

## 2023-10-12 DIAGNOSIS — H2511 Age-related nuclear cataract, right eye: Secondary | ICD-10-CM | POA: Diagnosis not present

## 2023-10-15 DIAGNOSIS — C3490 Malignant neoplasm of unspecified part of unspecified bronchus or lung: Secondary | ICD-10-CM | POA: Diagnosis not present

## 2023-10-15 DIAGNOSIS — I2510 Atherosclerotic heart disease of native coronary artery without angina pectoris: Secondary | ICD-10-CM | POA: Diagnosis not present

## 2023-10-15 DIAGNOSIS — M5134 Other intervertebral disc degeneration, thoracic region: Secondary | ICD-10-CM | POA: Diagnosis not present

## 2023-10-15 DIAGNOSIS — K7689 Other specified diseases of liver: Secondary | ICD-10-CM | POA: Diagnosis not present

## 2023-10-15 DIAGNOSIS — I3139 Other pericardial effusion (noninflammatory): Secondary | ICD-10-CM | POA: Diagnosis not present

## 2023-10-16 DIAGNOSIS — C61 Malignant neoplasm of prostate: Secondary | ICD-10-CM | POA: Diagnosis not present

## 2023-10-16 DIAGNOSIS — C3492 Malignant neoplasm of unspecified part of left bronchus or lung: Secondary | ICD-10-CM | POA: Diagnosis not present

## 2023-10-16 DIAGNOSIS — Z9221 Personal history of antineoplastic chemotherapy: Secondary | ICD-10-CM | POA: Diagnosis not present

## 2023-10-16 DIAGNOSIS — Z8042 Family history of malignant neoplasm of prostate: Secondary | ICD-10-CM | POA: Diagnosis not present

## 2023-10-16 DIAGNOSIS — Z923 Personal history of irradiation: Secondary | ICD-10-CM | POA: Diagnosis not present

## 2023-10-16 DIAGNOSIS — J690 Pneumonitis due to inhalation of food and vomit: Secondary | ICD-10-CM | POA: Diagnosis not present

## 2023-10-16 DIAGNOSIS — J449 Chronic obstructive pulmonary disease, unspecified: Secondary | ICD-10-CM | POA: Diagnosis not present

## 2023-10-16 DIAGNOSIS — E785 Hyperlipidemia, unspecified: Secondary | ICD-10-CM | POA: Diagnosis not present

## 2023-10-16 DIAGNOSIS — Z9089 Acquired absence of other organs: Secondary | ICD-10-CM | POA: Diagnosis not present

## 2023-10-16 DIAGNOSIS — Z87891 Personal history of nicotine dependence: Secondary | ICD-10-CM | POA: Diagnosis not present

## 2023-10-17 DIAGNOSIS — C3492 Malignant neoplasm of unspecified part of left bronchus or lung: Secondary | ICD-10-CM | POA: Diagnosis not present

## 2023-10-17 DIAGNOSIS — J690 Pneumonitis due to inhalation of food and vomit: Secondary | ICD-10-CM | POA: Diagnosis not present

## 2023-10-25 DIAGNOSIS — J90 Pleural effusion, not elsewhere classified: Secondary | ICD-10-CM | POA: Diagnosis not present

## 2023-10-25 DIAGNOSIS — E039 Hypothyroidism, unspecified: Secondary | ICD-10-CM | POA: Diagnosis not present

## 2023-10-25 DIAGNOSIS — C3492 Malignant neoplasm of unspecified part of left bronchus or lung: Secondary | ICD-10-CM | POA: Diagnosis not present

## 2023-10-25 DIAGNOSIS — Z0000 Encounter for general adult medical examination without abnormal findings: Secondary | ICD-10-CM | POA: Diagnosis not present

## 2023-10-25 DIAGNOSIS — C61 Malignant neoplasm of prostate: Secondary | ICD-10-CM | POA: Diagnosis not present

## 2023-10-25 DIAGNOSIS — J984 Other disorders of lung: Secondary | ICD-10-CM | POA: Diagnosis not present

## 2023-10-25 DIAGNOSIS — M5414 Radiculopathy, thoracic region: Secondary | ICD-10-CM | POA: Diagnosis not present

## 2023-10-25 DIAGNOSIS — I2699 Other pulmonary embolism without acute cor pulmonale: Secondary | ICD-10-CM | POA: Diagnosis not present

## 2023-10-25 DIAGNOSIS — R799 Abnormal finding of blood chemistry, unspecified: Secondary | ICD-10-CM | POA: Diagnosis not present

## 2023-11-05 DIAGNOSIS — Z7901 Long term (current) use of anticoagulants: Secondary | ICD-10-CM | POA: Diagnosis not present

## 2023-11-05 DIAGNOSIS — Z7951 Long term (current) use of inhaled steroids: Secondary | ICD-10-CM | POA: Diagnosis not present

## 2023-11-05 DIAGNOSIS — E785 Hyperlipidemia, unspecified: Secondary | ICD-10-CM | POA: Diagnosis not present

## 2023-11-05 DIAGNOSIS — J449 Chronic obstructive pulmonary disease, unspecified: Secondary | ICD-10-CM | POA: Diagnosis not present

## 2023-11-05 DIAGNOSIS — J189 Pneumonia, unspecified organism: Secondary | ICD-10-CM | POA: Diagnosis not present

## 2023-11-05 DIAGNOSIS — J181 Lobar pneumonia, unspecified organism: Secondary | ICD-10-CM | POA: Diagnosis not present

## 2023-11-05 DIAGNOSIS — Z87891 Personal history of nicotine dependence: Secondary | ICD-10-CM | POA: Diagnosis not present

## 2023-11-05 DIAGNOSIS — Z20822 Contact with and (suspected) exposure to covid-19: Secondary | ICD-10-CM | POA: Diagnosis not present

## 2023-11-05 DIAGNOSIS — Z79890 Hormone replacement therapy (postmenopausal): Secondary | ICD-10-CM | POA: Diagnosis not present

## 2023-11-05 DIAGNOSIS — J9811 Atelectasis: Secondary | ICD-10-CM | POA: Diagnosis not present

## 2023-11-05 DIAGNOSIS — Z79899 Other long term (current) drug therapy: Secondary | ICD-10-CM | POA: Diagnosis not present

## 2023-11-14 DIAGNOSIS — B440 Invasive pulmonary aspergillosis: Secondary | ICD-10-CM | POA: Diagnosis not present

## 2023-11-14 DIAGNOSIS — Z9189 Other specified personal risk factors, not elsewhere classified: Secondary | ICD-10-CM | POA: Diagnosis not present

## 2023-11-14 DIAGNOSIS — B449 Aspergillosis, unspecified: Secondary | ICD-10-CM | POA: Diagnosis not present

## 2023-11-14 DIAGNOSIS — I498 Other specified cardiac arrhythmias: Secondary | ICD-10-CM | POA: Diagnosis not present

## 2023-11-16 DIAGNOSIS — Z9189 Other specified personal risk factors, not elsewhere classified: Secondary | ICD-10-CM | POA: Diagnosis not present

## 2023-11-16 DIAGNOSIS — I498 Other specified cardiac arrhythmias: Secondary | ICD-10-CM | POA: Diagnosis not present

## 2023-11-19 DIAGNOSIS — J439 Emphysema, unspecified: Secondary | ICD-10-CM | POA: Diagnosis not present

## 2023-11-19 DIAGNOSIS — Z79899 Other long term (current) drug therapy: Secondary | ICD-10-CM | POA: Diagnosis not present

## 2023-11-19 DIAGNOSIS — C61 Malignant neoplasm of prostate: Secondary | ICD-10-CM | POA: Diagnosis not present

## 2023-11-19 DIAGNOSIS — Z9181 History of falling: Secondary | ICD-10-CM | POA: Diagnosis not present

## 2023-11-19 DIAGNOSIS — B441 Other pulmonary aspergillosis: Secondary | ICD-10-CM | POA: Diagnosis not present

## 2023-11-19 DIAGNOSIS — Z1331 Encounter for screening for depression: Secondary | ICD-10-CM | POA: Diagnosis not present

## 2023-11-19 DIAGNOSIS — Z86711 Personal history of pulmonary embolism: Secondary | ICD-10-CM | POA: Diagnosis not present

## 2023-11-19 DIAGNOSIS — Z23 Encounter for immunization: Secondary | ICD-10-CM | POA: Diagnosis not present

## 2023-11-19 DIAGNOSIS — C3490 Malignant neoplasm of unspecified part of unspecified bronchus or lung: Secondary | ICD-10-CM | POA: Diagnosis not present

## 2023-11-30 DIAGNOSIS — C3492 Malignant neoplasm of unspecified part of left bronchus or lung: Secondary | ICD-10-CM | POA: Diagnosis not present

## 2023-11-30 DIAGNOSIS — J929 Pleural plaque without asbestos: Secondary | ICD-10-CM | POA: Diagnosis not present

## 2023-11-30 DIAGNOSIS — R918 Other nonspecific abnormal finding of lung field: Secondary | ICD-10-CM | POA: Diagnosis not present

## 2023-11-30 DIAGNOSIS — J690 Pneumonitis due to inhalation of food and vomit: Secondary | ICD-10-CM | POA: Diagnosis not present

## 2023-12-04 DIAGNOSIS — C3492 Malignant neoplasm of unspecified part of left bronchus or lung: Secondary | ICD-10-CM | POA: Diagnosis not present

## 2023-12-04 DIAGNOSIS — J690 Pneumonitis due to inhalation of food and vomit: Secondary | ICD-10-CM | POA: Diagnosis not present

## 2023-12-08 DIAGNOSIS — C3492 Malignant neoplasm of unspecified part of left bronchus or lung: Secondary | ICD-10-CM | POA: Diagnosis not present

## 2023-12-13 DIAGNOSIS — B440 Invasive pulmonary aspergillosis: Secondary | ICD-10-CM | POA: Diagnosis not present

## 2023-12-13 DIAGNOSIS — I498 Other specified cardiac arrhythmias: Secondary | ICD-10-CM | POA: Diagnosis not present

## 2023-12-13 DIAGNOSIS — Z9189 Other specified personal risk factors, not elsewhere classified: Secondary | ICD-10-CM | POA: Diagnosis not present

## 2023-12-20 DIAGNOSIS — B440 Invasive pulmonary aspergillosis: Secondary | ICD-10-CM | POA: Diagnosis not present

## 2023-12-20 DIAGNOSIS — Z9189 Other specified personal risk factors, not elsewhere classified: Secondary | ICD-10-CM | POA: Diagnosis not present

## 2023-12-21 DIAGNOSIS — Z9189 Other specified personal risk factors, not elsewhere classified: Secondary | ICD-10-CM | POA: Diagnosis not present

## 2023-12-21 DIAGNOSIS — I498 Other specified cardiac arrhythmias: Secondary | ICD-10-CM | POA: Diagnosis not present

## 2023-12-27 DIAGNOSIS — Z9189 Other specified personal risk factors, not elsewhere classified: Secondary | ICD-10-CM | POA: Diagnosis not present

## 2023-12-27 DIAGNOSIS — B440 Invasive pulmonary aspergillosis: Secondary | ICD-10-CM | POA: Diagnosis not present

## 2024-01-02 DIAGNOSIS — Z87891 Personal history of nicotine dependence: Secondary | ICD-10-CM | POA: Diagnosis not present

## 2024-01-02 DIAGNOSIS — C3490 Malignant neoplasm of unspecified part of unspecified bronchus or lung: Secondary | ICD-10-CM | POA: Diagnosis not present

## 2024-01-02 DIAGNOSIS — Z7901 Long term (current) use of anticoagulants: Secondary | ICD-10-CM | POA: Diagnosis not present

## 2024-01-02 DIAGNOSIS — R079 Chest pain, unspecified: Secondary | ICD-10-CM | POA: Diagnosis not present

## 2024-01-02 DIAGNOSIS — J449 Chronic obstructive pulmonary disease, unspecified: Secondary | ICD-10-CM | POA: Diagnosis not present

## 2024-01-02 DIAGNOSIS — R911 Solitary pulmonary nodule: Secondary | ICD-10-CM | POA: Diagnosis not present

## 2024-01-02 DIAGNOSIS — E785 Hyperlipidemia, unspecified: Secondary | ICD-10-CM | POA: Diagnosis not present

## 2024-01-02 DIAGNOSIS — J701 Chronic and other pulmonary manifestations due to radiation: Secondary | ICD-10-CM | POA: Diagnosis not present

## 2024-01-02 DIAGNOSIS — I3139 Other pericardial effusion (noninflammatory): Secondary | ICD-10-CM | POA: Diagnosis not present

## 2024-01-02 DIAGNOSIS — J90 Pleural effusion, not elsewhere classified: Secondary | ICD-10-CM | POA: Diagnosis not present

## 2024-01-02 DIAGNOSIS — I2510 Atherosclerotic heart disease of native coronary artery without angina pectoris: Secondary | ICD-10-CM | POA: Diagnosis not present

## 2024-01-03 DIAGNOSIS — Z9189 Other specified personal risk factors, not elsewhere classified: Secondary | ICD-10-CM | POA: Diagnosis not present

## 2024-01-03 DIAGNOSIS — R079 Chest pain, unspecified: Secondary | ICD-10-CM | POA: Diagnosis not present

## 2024-01-03 DIAGNOSIS — B440 Invasive pulmonary aspergillosis: Secondary | ICD-10-CM | POA: Diagnosis not present

## 2024-01-10 DIAGNOSIS — B440 Invasive pulmonary aspergillosis: Secondary | ICD-10-CM | POA: Diagnosis not present

## 2024-01-10 DIAGNOSIS — Z9189 Other specified personal risk factors, not elsewhere classified: Secondary | ICD-10-CM | POA: Diagnosis not present

## 2024-01-17 DIAGNOSIS — C3490 Malignant neoplasm of unspecified part of unspecified bronchus or lung: Secondary | ICD-10-CM | POA: Diagnosis not present

## 2024-01-17 DIAGNOSIS — K208 Other esophagitis without bleeding: Secondary | ICD-10-CM | POA: Diagnosis not present

## 2024-01-17 DIAGNOSIS — I2699 Other pulmonary embolism without acute cor pulmonale: Secondary | ICD-10-CM | POA: Diagnosis not present

## 2024-01-17 DIAGNOSIS — M546 Pain in thoracic spine: Secondary | ICD-10-CM | POA: Diagnosis not present

## 2024-01-17 DIAGNOSIS — C3492 Malignant neoplasm of unspecified part of left bronchus or lung: Secondary | ICD-10-CM | POA: Diagnosis not present

## 2024-01-17 DIAGNOSIS — C7970 Secondary malignant neoplasm of unspecified adrenal gland: Secondary | ICD-10-CM | POA: Diagnosis not present

## 2024-01-17 DIAGNOSIS — B440 Invasive pulmonary aspergillosis: Secondary | ICD-10-CM | POA: Diagnosis not present

## 2024-01-17 DIAGNOSIS — Z9189 Other specified personal risk factors, not elsewhere classified: Secondary | ICD-10-CM | POA: Diagnosis not present

## 2024-01-17 DIAGNOSIS — T66XXXA Radiation sickness, unspecified, initial encounter: Secondary | ICD-10-CM | POA: Diagnosis not present

## 2024-01-22 DIAGNOSIS — Z125 Encounter for screening for malignant neoplasm of prostate: Secondary | ICD-10-CM | POA: Diagnosis not present

## 2024-01-22 DIAGNOSIS — E039 Hypothyroidism, unspecified: Secondary | ICD-10-CM | POA: Diagnosis not present

## 2024-01-22 DIAGNOSIS — C61 Malignant neoplasm of prostate: Secondary | ICD-10-CM | POA: Diagnosis not present

## 2024-01-22 DIAGNOSIS — Z0000 Encounter for general adult medical examination without abnormal findings: Secondary | ICD-10-CM | POA: Diagnosis not present

## 2024-01-22 DIAGNOSIS — B449 Aspergillosis, unspecified: Secondary | ICD-10-CM | POA: Diagnosis not present

## 2024-01-22 DIAGNOSIS — R799 Abnormal finding of blood chemistry, unspecified: Secondary | ICD-10-CM | POA: Diagnosis not present

## 2024-01-22 DIAGNOSIS — C3492 Malignant neoplasm of unspecified part of left bronchus or lung: Secondary | ICD-10-CM | POA: Diagnosis not present

## 2024-01-24 DIAGNOSIS — M546 Pain in thoracic spine: Secondary | ICD-10-CM | POA: Diagnosis not present

## 2024-01-24 DIAGNOSIS — C3490 Malignant neoplasm of unspecified part of unspecified bronchus or lung: Secondary | ICD-10-CM | POA: Diagnosis not present

## 2024-01-24 DIAGNOSIS — C7970 Secondary malignant neoplasm of unspecified adrenal gland: Secondary | ICD-10-CM | POA: Diagnosis not present

## 2024-01-24 DIAGNOSIS — I2699 Other pulmonary embolism without acute cor pulmonale: Secondary | ICD-10-CM | POA: Diagnosis not present

## 2024-01-24 DIAGNOSIS — K208 Other esophagitis without bleeding: Secondary | ICD-10-CM | POA: Diagnosis not present

## 2024-01-24 DIAGNOSIS — Z9189 Other specified personal risk factors, not elsewhere classified: Secondary | ICD-10-CM | POA: Diagnosis not present

## 2024-01-24 DIAGNOSIS — T66XXXA Radiation sickness, unspecified, initial encounter: Secondary | ICD-10-CM | POA: Diagnosis not present

## 2024-01-24 DIAGNOSIS — C3492 Malignant neoplasm of unspecified part of left bronchus or lung: Secondary | ICD-10-CM | POA: Diagnosis not present

## 2024-01-24 DIAGNOSIS — B440 Invasive pulmonary aspergillosis: Secondary | ICD-10-CM | POA: Diagnosis not present

## 2024-01-29 DIAGNOSIS — R799 Abnormal finding of blood chemistry, unspecified: Secondary | ICD-10-CM | POA: Diagnosis not present

## 2024-01-29 DIAGNOSIS — Z0000 Encounter for general adult medical examination without abnormal findings: Secondary | ICD-10-CM | POA: Diagnosis not present

## 2024-01-29 DIAGNOSIS — C61 Malignant neoplasm of prostate: Secondary | ICD-10-CM | POA: Diagnosis not present

## 2024-01-30 DIAGNOSIS — B440 Invasive pulmonary aspergillosis: Secondary | ICD-10-CM | POA: Diagnosis not present

## 2024-01-30 DIAGNOSIS — Z9189 Other specified personal risk factors, not elsewhere classified: Secondary | ICD-10-CM | POA: Diagnosis not present

## 2024-02-07 DIAGNOSIS — Z9189 Other specified personal risk factors, not elsewhere classified: Secondary | ICD-10-CM | POA: Diagnosis not present

## 2024-02-07 DIAGNOSIS — B440 Invasive pulmonary aspergillosis: Secondary | ICD-10-CM | POA: Diagnosis not present

## 2024-02-21 DIAGNOSIS — Z9189 Other specified personal risk factors, not elsewhere classified: Secondary | ICD-10-CM | POA: Diagnosis not present

## 2024-02-21 DIAGNOSIS — B440 Invasive pulmonary aspergillosis: Secondary | ICD-10-CM | POA: Diagnosis not present

## 2024-03-06 DIAGNOSIS — Z9189 Other specified personal risk factors, not elsewhere classified: Secondary | ICD-10-CM | POA: Diagnosis not present

## 2024-03-06 DIAGNOSIS — B440 Invasive pulmonary aspergillosis: Secondary | ICD-10-CM | POA: Diagnosis not present

## 2024-03-14 DIAGNOSIS — C7951 Secondary malignant neoplasm of bone: Secondary | ICD-10-CM | POA: Diagnosis not present

## 2024-03-14 DIAGNOSIS — C3492 Malignant neoplasm of unspecified part of left bronchus or lung: Secondary | ICD-10-CM | POA: Diagnosis not present

## 2024-03-14 DIAGNOSIS — J690 Pneumonitis due to inhalation of food and vomit: Secondary | ICD-10-CM | POA: Diagnosis not present

## 2024-03-18 DIAGNOSIS — J690 Pneumonitis due to inhalation of food and vomit: Secondary | ICD-10-CM | POA: Diagnosis not present

## 2024-03-18 DIAGNOSIS — C3492 Malignant neoplasm of unspecified part of left bronchus or lung: Secondary | ICD-10-CM | POA: Diagnosis not present

## 2024-03-18 DIAGNOSIS — E785 Hyperlipidemia, unspecified: Secondary | ICD-10-CM | POA: Diagnosis not present

## 2024-03-18 DIAGNOSIS — I739 Peripheral vascular disease, unspecified: Secondary | ICD-10-CM | POA: Diagnosis not present

## 2024-03-18 DIAGNOSIS — C7951 Secondary malignant neoplasm of bone: Secondary | ICD-10-CM | POA: Diagnosis not present

## 2024-03-18 DIAGNOSIS — Z86711 Personal history of pulmonary embolism: Secondary | ICD-10-CM | POA: Diagnosis not present

## 2024-03-18 DIAGNOSIS — Z86718 Personal history of other venous thrombosis and embolism: Secondary | ICD-10-CM | POA: Diagnosis not present

## 2024-03-18 DIAGNOSIS — R600 Localized edema: Secondary | ICD-10-CM | POA: Diagnosis not present

## 2024-03-18 DIAGNOSIS — J449 Chronic obstructive pulmonary disease, unspecified: Secondary | ICD-10-CM | POA: Diagnosis not present

## 2024-03-18 DIAGNOSIS — R0989 Other specified symptoms and signs involving the circulatory and respiratory systems: Secondary | ICD-10-CM | POA: Diagnosis not present

## 2024-03-18 DIAGNOSIS — C61 Malignant neoplasm of prostate: Secondary | ICD-10-CM | POA: Diagnosis not present

## 2024-03-20 DIAGNOSIS — Z9189 Other specified personal risk factors, not elsewhere classified: Secondary | ICD-10-CM | POA: Diagnosis not present

## 2024-03-20 DIAGNOSIS — C61 Malignant neoplasm of prostate: Secondary | ICD-10-CM | POA: Diagnosis not present

## 2024-03-20 DIAGNOSIS — B440 Invasive pulmonary aspergillosis: Secondary | ICD-10-CM | POA: Diagnosis not present

## 2024-03-31 DIAGNOSIS — C3492 Malignant neoplasm of unspecified part of left bronchus or lung: Secondary | ICD-10-CM | POA: Diagnosis not present

## 2024-03-31 DIAGNOSIS — C3412 Malignant neoplasm of upper lobe, left bronchus or lung: Secondary | ICD-10-CM | POA: Diagnosis not present

## 2024-03-31 DIAGNOSIS — C7951 Secondary malignant neoplasm of bone: Secondary | ICD-10-CM | POA: Diagnosis not present

## 2024-03-31 DIAGNOSIS — J690 Pneumonitis due to inhalation of food and vomit: Secondary | ICD-10-CM | POA: Diagnosis not present

## 2024-04-01 DIAGNOSIS — J690 Pneumonitis due to inhalation of food and vomit: Secondary | ICD-10-CM | POA: Diagnosis not present

## 2024-04-01 DIAGNOSIS — C3492 Malignant neoplasm of unspecified part of left bronchus or lung: Secondary | ICD-10-CM | POA: Diagnosis not present

## 2024-04-03 DIAGNOSIS — Z9189 Other specified personal risk factors, not elsewhere classified: Secondary | ICD-10-CM | POA: Diagnosis not present

## 2024-04-03 DIAGNOSIS — B440 Invasive pulmonary aspergillosis: Secondary | ICD-10-CM | POA: Diagnosis not present

## 2024-04-08 DIAGNOSIS — R799 Abnormal finding of blood chemistry, unspecified: Secondary | ICD-10-CM | POA: Diagnosis not present

## 2024-04-08 DIAGNOSIS — C61 Malignant neoplasm of prostate: Secondary | ICD-10-CM | POA: Diagnosis not present

## 2024-04-08 DIAGNOSIS — C7951 Secondary malignant neoplasm of bone: Secondary | ICD-10-CM | POA: Diagnosis not present

## 2024-04-08 DIAGNOSIS — Z0000 Encounter for general adult medical examination without abnormal findings: Secondary | ICD-10-CM | POA: Diagnosis not present

## 2024-04-14 DIAGNOSIS — C7951 Secondary malignant neoplasm of bone: Secondary | ICD-10-CM | POA: Diagnosis not present

## 2024-04-14 DIAGNOSIS — C61 Malignant neoplasm of prostate: Secondary | ICD-10-CM | POA: Diagnosis not present

## 2024-04-16 DIAGNOSIS — C7951 Secondary malignant neoplasm of bone: Secondary | ICD-10-CM | POA: Diagnosis not present

## 2024-04-16 DIAGNOSIS — C61 Malignant neoplasm of prostate: Secondary | ICD-10-CM | POA: Diagnosis not present

## 2024-04-17 DIAGNOSIS — B440 Invasive pulmonary aspergillosis: Secondary | ICD-10-CM | POA: Diagnosis not present

## 2024-04-17 DIAGNOSIS — Z9189 Other specified personal risk factors, not elsewhere classified: Secondary | ICD-10-CM | POA: Diagnosis not present

## 2024-04-21 DIAGNOSIS — C61 Malignant neoplasm of prostate: Secondary | ICD-10-CM | POA: Diagnosis not present

## 2024-04-21 DIAGNOSIS — R799 Abnormal finding of blood chemistry, unspecified: Secondary | ICD-10-CM | POA: Diagnosis not present

## 2024-04-21 DIAGNOSIS — Z0000 Encounter for general adult medical examination without abnormal findings: Secondary | ICD-10-CM | POA: Diagnosis not present

## 2024-04-22 DIAGNOSIS — R799 Abnormal finding of blood chemistry, unspecified: Secondary | ICD-10-CM | POA: Diagnosis not present

## 2024-04-22 DIAGNOSIS — C61 Malignant neoplasm of prostate: Secondary | ICD-10-CM | POA: Diagnosis not present

## 2024-04-22 DIAGNOSIS — Z0000 Encounter for general adult medical examination without abnormal findings: Secondary | ICD-10-CM | POA: Diagnosis not present

## 2024-04-23 DIAGNOSIS — Z0000 Encounter for general adult medical examination without abnormal findings: Secondary | ICD-10-CM | POA: Diagnosis not present

## 2024-04-23 DIAGNOSIS — R799 Abnormal finding of blood chemistry, unspecified: Secondary | ICD-10-CM | POA: Diagnosis not present

## 2024-04-23 DIAGNOSIS — C61 Malignant neoplasm of prostate: Secondary | ICD-10-CM | POA: Diagnosis not present

## 2024-04-24 DIAGNOSIS — Z0000 Encounter for general adult medical examination without abnormal findings: Secondary | ICD-10-CM | POA: Diagnosis not present

## 2024-04-24 DIAGNOSIS — C61 Malignant neoplasm of prostate: Secondary | ICD-10-CM | POA: Diagnosis not present

## 2024-04-24 DIAGNOSIS — R799 Abnormal finding of blood chemistry, unspecified: Secondary | ICD-10-CM | POA: Diagnosis not present

## 2024-04-25 DIAGNOSIS — C7951 Secondary malignant neoplasm of bone: Secondary | ICD-10-CM | POA: Diagnosis not present

## 2024-04-25 DIAGNOSIS — R799 Abnormal finding of blood chemistry, unspecified: Secondary | ICD-10-CM | POA: Diagnosis not present

## 2024-04-25 DIAGNOSIS — Z0000 Encounter for general adult medical examination without abnormal findings: Secondary | ICD-10-CM | POA: Diagnosis not present

## 2024-04-25 DIAGNOSIS — C61 Malignant neoplasm of prostate: Secondary | ICD-10-CM | POA: Diagnosis not present

## 2024-04-29 DIAGNOSIS — C61 Malignant neoplasm of prostate: Secondary | ICD-10-CM | POA: Diagnosis not present

## 2024-04-29 DIAGNOSIS — R799 Abnormal finding of blood chemistry, unspecified: Secondary | ICD-10-CM | POA: Diagnosis not present

## 2024-04-29 DIAGNOSIS — Z0000 Encounter for general adult medical examination without abnormal findings: Secondary | ICD-10-CM | POA: Diagnosis not present

## 2024-04-29 DIAGNOSIS — Z23 Encounter for immunization: Secondary | ICD-10-CM | POA: Diagnosis not present

## 2024-04-29 DIAGNOSIS — E039 Hypothyroidism, unspecified: Secondary | ICD-10-CM | POA: Diagnosis not present

## 2024-04-29 DIAGNOSIS — I2699 Other pulmonary embolism without acute cor pulmonale: Secondary | ICD-10-CM | POA: Diagnosis not present

## 2024-04-29 DIAGNOSIS — C3492 Malignant neoplasm of unspecified part of left bronchus or lung: Secondary | ICD-10-CM | POA: Diagnosis not present

## 2024-05-01 DIAGNOSIS — B440 Invasive pulmonary aspergillosis: Secondary | ICD-10-CM | POA: Diagnosis not present

## 2024-05-01 DIAGNOSIS — Z9189 Other specified personal risk factors, not elsewhere classified: Secondary | ICD-10-CM | POA: Diagnosis not present

## 2024-05-20 DIAGNOSIS — Z79899 Other long term (current) drug therapy: Secondary | ICD-10-CM | POA: Diagnosis not present

## 2024-05-20 DIAGNOSIS — C3490 Malignant neoplasm of unspecified part of unspecified bronchus or lung: Secondary | ICD-10-CM | POA: Diagnosis not present

## 2024-05-20 DIAGNOSIS — R7303 Prediabetes: Secondary | ICD-10-CM | POA: Diagnosis not present

## 2024-05-20 DIAGNOSIS — Z86711 Personal history of pulmonary embolism: Secondary | ICD-10-CM | POA: Diagnosis not present

## 2024-05-20 DIAGNOSIS — E7800 Pure hypercholesterolemia, unspecified: Secondary | ICD-10-CM | POA: Diagnosis not present

## 2024-05-20 DIAGNOSIS — F419 Anxiety disorder, unspecified: Secondary | ICD-10-CM | POA: Diagnosis not present

## 2024-05-20 DIAGNOSIS — C7951 Secondary malignant neoplasm of bone: Secondary | ICD-10-CM | POA: Diagnosis not present

## 2024-05-20 DIAGNOSIS — J439 Emphysema, unspecified: Secondary | ICD-10-CM | POA: Diagnosis not present

## 2024-05-20 DIAGNOSIS — C61 Malignant neoplasm of prostate: Secondary | ICD-10-CM | POA: Diagnosis not present

## 2024-05-22 DIAGNOSIS — Z9189 Other specified personal risk factors, not elsewhere classified: Secondary | ICD-10-CM | POA: Diagnosis not present
